# Patient Record
Sex: Male | Born: 1950 | Race: White | Hispanic: No | Smoking: Current every day smoker
Health system: Southern US, Community
[De-identification: ages and names within clinical notes are randomized; demographics above are authoritative.]

## PROBLEM LIST (undated history)

## (undated) DIAGNOSIS — C61 Malignant neoplasm of prostate: Secondary | ICD-10-CM

## (undated) DIAGNOSIS — R351 Nocturia: Secondary | ICD-10-CM

## (undated) DIAGNOSIS — I10 Essential (primary) hypertension: Secondary | ICD-10-CM

## (undated) DIAGNOSIS — M1990 Unspecified osteoarthritis, unspecified site: Secondary | ICD-10-CM

## (undated) HISTORY — PX: PROSTATE BIOPSY: SHX241

## (undated) HISTORY — PX: NO PAST SURGERIES: SHX2092

## (undated) HISTORY — DX: Essential (primary) hypertension: I10

---

## 2008-03-08 DIAGNOSIS — A6920 Lyme disease, unspecified: Secondary | ICD-10-CM

## 2008-03-08 HISTORY — DX: Lyme disease, unspecified: A69.20

## 2019-10-04 NOTE — Progress Notes (Signed)
Subjective: 1. Elevated prostate specific antigen (PSA)   2. Prostate nodule      [name] is a 69 yo male who is sent in consultation by Dr. Monico Blitz for an elevated PSA that was 6.5 in May and 6.6. in June with an 11% f/t ratio.  He has no other GU history.   He has had prior normal PSA's in [place] in the past.  His IPSS is 3.    ROS:  ROS  Not on File  Past Medical History:  Diagnosis Date   Hypertension     History reviewed. No pertinent surgical history.  Social History   Socioeconomic History   Marital status: Widowed    Spouse name: Not on file   Number of children: 1   Years of education: Not on file   Highest education level: Not on file  Occupational History   Occupation: retired  Tobacco Use   Smoking status: Current Every Day Smoker    Packs/day: 0.50    Years: 30.00    Pack years: 15.00   Smokeless tobacco: Never Used  Substance and Sexual Activity   Alcohol use: Not Currently   Drug use: Not on file   Sexual activity: Not on file  Other Topics Concern   Not on file  Social History Narrative   Not on file   Social Determinants of Health   Financial Resource Strain:    Difficulty of Paying Living Expenses:   Food Insecurity:    Worried About Charity fundraiser in the Last Year:    Arboriculturist in the Last Year:   Transportation Needs:    Film/video editor (Medical):    Lack of Transportation (Non-Medical):   Physical Activity:    Days of Exercise per Week:    Minutes of Exercise per Session:   Stress:    Feeling of Stress :   Social Connections:    Frequency of Communication with Friends and Family:    Frequency of Social Gatherings with Friends and Family:    Attends Religious Services:    Active Member of Clubs or Organizations:    Attends Music therapist:    Marital Status:   Intimate Partner Violence:    Fear of Current or Ex-Partner:    Emotionally Abused:    Physically  Abused:    Sexually Abused:     History reviewed. No pertinent family history.  Anti-infectives: Anti-infectives (From admission, onward)   Start     Dose/Rate Route Frequency Ordered Stop   10/05/19 0000  levofloxacin (LEVAQUIN) 750 MG tablet     Discontinue        10/05/19 0916        Current Outpatient Medications  Medication Sig Dispense Refill   amLODipine (NORVASC) 10 MG tablet Take 10 mg by mouth daily.     aspirin 81 MG chewable tablet Chew by mouth daily.     hydrochlorothiazide (HYDRODIURIL) 25 MG tablet Take 25 mg by mouth daily.     levofloxacin (LEVAQUIN) 750 MG tablet Take 1 po one hour prior to the procedure. 1 tablet 0   losartan (COZAAR) 100 MG tablet Take 100 mg by mouth daily.     naproxen sodium (ALEVE) 220 MG tablet Take 220 mg by mouth daily.     No current facility-administered medications for this visit.     Objective: Vital signs in last 24 hours: BP 126/75    Pulse 71    Temp 98.1  F (36.7 C)    Ht 6' 1.5" (1.867 m)    Wt (!) 287 lb (130.2 kg)    BMI 37.35 kg/m   Intake/Output from previous day: No intake/output data recorded. Intake/Output this shift: @IOTHISSHIFT @   Physical Exam Vitals reviewed.  Constitutional:      Appearance: Normal appearance. He is obese.  Cardiovascular:     Rate and Rhythm: Normal rate and regular rhythm.     Heart sounds: Normal heart sounds.  Pulmonary:     Effort: Pulmonary effort is normal.     Breath sounds: Normal breath sounds.  Abdominal:     Palpations: Abdomen is soft. There is no mass.     Tenderness: There is no abdominal tenderness.     Hernia: No hernia is present.  Genitourinary:    Comments: Uncirc phallus with adequate meatus. Scrotum, testes and epididymitis normal. AP without lesions. NST without mass. Prostate 2+ with right lateral base nodule. SV normal.  Musculoskeletal:        General: No swelling. Normal range of motion.     Cervical back: Normal range of motion and neck  supple.  Skin:    General: Skin is warm and dry.  Neurological:     General: No focal deficit present.     Mental Status: He is alert and oriented to person, place, and time.  Psychiatric:        Mood and Affect: Mood normal.        Behavior: Behavior normal.     Lab Results:  Results for orders placed or performed in visit on 10/05/19 (from the past 24 hour(s))  Urinalysis, Routine w reflex microscopic     Status: None   Collection Time: 10/05/19  8:47 AM  Result Value Ref Range   Specific Gravity, UA 1.015 1.005 - 1.030   pH, UA 6.5 5.0 - 7.5   Color, UA Yellow Yellow   Appearance Ur Clear Clear   Leukocytes,UA Negative Negative   Protein,UA Negative Negative/Trace   Glucose, UA Negative Negative   Ketones, UA Negative Negative   RBC, UA Negative Negative   Bilirubin, UA Negative Negative   Urobilinogen, Ur 0.2 0.2 - 1.0 mg/dL   Nitrite, UA Negative Negative   Microscopic Examination Comment    Narrative   Performed at:  [facility] [address] [place], [place]  [id number] Lab Director: Mina Marble MT, Phone:  [phone]    BMET No results for input(s): NA, K, CL, CO2, GLUCOSE, BUN, CREATININE, CALCIUM in the last 72 hours. PT/INR No results for input(s): LABPROT, INR in the last 72 hours. ABG No results for input(s): PHART, HCO3 in the last 72 hours.  Invalid input(s): PCO2, PO2  Studies/Results: No results found.   Assessment/Plan: Elevated prostate specific antigen (PSA) He has an elevated PSA with a right base nodule.  He needs a biopsy.  I have reviewed the risks of bleeding, infection and voiding difficulty.  Levaquin sent.     Meds ordered this encounter  Medications   levofloxacin (LEVAQUIN) 750 MG tablet    Sig: Take 1 po one hour prior to the procedure.    Dispense:  1 tablet    Refill:  0     Orders Placed This Encounter  Procedures   [place] PROSTATE BIOPSY MULTIPLE    Standing Status:   Future    Standing Expiration  Date:   11/05/2019    Order Specific Question:   Reason for Exam (SYMPTOM  OR DIAGNOSIS REQUIRED)    Answer:   elevated PSA    Order Specific Question:   Preferred location?    Answer:   [facility]   Urinalysis, Routine w reflex microscopic     Return for He will need f/u 2-3 weeks after the prostate biopsy.    CC: Dr. Monico Blitz.    Irine Seal 10/05/2019 [phone]

## 2019-10-05 ENCOUNTER — Other Ambulatory Visit: Payer: Self-pay | Admitting: Urology

## 2019-10-05 ENCOUNTER — Other Ambulatory Visit: Payer: Self-pay

## 2019-10-05 ENCOUNTER — Ambulatory Visit: Payer: Self-pay | Admitting: Urology

## 2019-10-05 ENCOUNTER — Encounter: Payer: Self-pay | Admitting: Urology

## 2019-10-05 ENCOUNTER — Ambulatory Visit (INDEPENDENT_AMBULATORY_CARE_PROVIDER_SITE_OTHER): Payer: Medicare HMO | Admitting: Urology

## 2019-10-05 VITALS — BP 126/75 | HR 71 | Temp 98.1°F | Ht 73.5 in | Wt 287.0 lb

## 2019-10-05 DIAGNOSIS — N402 Nodular prostate without lower urinary tract symptoms: Secondary | ICD-10-CM

## 2019-10-05 DIAGNOSIS — R972 Elevated prostate specific antigen [PSA]: Secondary | ICD-10-CM

## 2019-10-05 LAB — URINALYSIS, ROUTINE W REFLEX MICROSCOPIC
Bilirubin, UA: NEGATIVE
Glucose, UA: NEGATIVE
Ketones, UA: NEGATIVE
Leukocytes,UA: NEGATIVE
Nitrite, UA: NEGATIVE
Protein,UA: NEGATIVE
RBC, UA: NEGATIVE
Specific Gravity, UA: 1.015 (ref 1.005–1.030)
Urobilinogen, Ur: 0.2 mg/dL (ref 0.2–1.0)
pH, UA: 6.5 (ref 5.0–7.5)

## 2019-10-05 MED ORDER — LEVOFLOXACIN 750 MG PO TABS: ORAL_TABLET | ORAL | 0 refills | Status: DC

## 2019-10-05 NOTE — Progress Notes (Signed)
° °  Appointment Time: 8:30 arrive 8:15 Appointment Date: 11/09/19  Location: [facility] Radiology Department   Prostate Biopsy Instructions  Stop all aspirin or blood thinners (aspirin, plavix, coumadin, warfarin, motrin, ibuprofen, advil, aleve, naproxen, naprosyn) for 7 days prior to the procedure.  If you have any questions about stopping these medications, please contact your primary care physician or cardiologist.  Having a light meal prior to the procedure is recommended.  If you are diabetic or have low blood sugar please bring a small snack or glucose tablet.  A Fleets enema is needed to be purchased over the counter at a local pharmacy and used 2 hours before you scheduled appointment.  This can be purchased over the counter at any pharmacy.  Antibiotics will be administered in the clinic at the time of the procedure and 1 tablet has been sent to your pharmacy. Please take the antibiotic as prescribed.    Please bring someone with you to the procedure to drive you home.   If you have any questions or concerns, please feel free to call the office at [phone] or send a Mychart message.    Thank you, [facility]

## 2019-10-05 NOTE — Progress Notes (Signed)
Urological Symptom Review  Patient is experiencing the following symptoms: None   Review of Systems  Gastrointestinal (upper)  : Negative for upper GI symptoms  Gastrointestinal (lower) : Negative for lower GI symptoms  Constitutional : Negative for symptoms  Skin: Negative for skin symptoms  Eyes: Negative for eye symptoms  Ear/Nose/Throat : Negative for Ear/Nose/Throat symptoms  Hematologic/Lymphatic: Negative for Hematologic/Lymphatic symptoms  Cardiovascular : Negative for cardiovascular symptoms  Respiratory : Negative for respiratory symptoms  Endocrine: Negative for endocrine symptoms  Musculoskeletal: Back pain  Neurological: Negative for neurological symptoms  Psychologic: Negative for psychiatric symptoms  

## 2019-10-05 NOTE — Assessment & Plan Note (Signed)
He has an elevated PSA with a right base nodule.  He needs a biopsy.  I have reviewed the risks of bleeding, infection and voiding difficulty.  Levaquin sent.

## 2019-10-05 NOTE — Patient Instructions (Signed)
   Appointment Time: Appointment Date:  Location: [facility] Radiology Department   Prostate Biopsy Instructions  Stop all aspirin or blood thinners (aspirin, plavix, coumadin, warfarin, motrin, ibuprofen, advil, aleve, naproxen, naprosyn) for 7 days prior to the procedure.  If you have any questions about stopping these medications, please contact your primary care physician or cardiologist.  Having a light meal prior to the procedure is recommended.  If you are diabetic or have low blood sugar please bring a small snack or glucose tablet.  A Fleets enema is needed to be purchased over the counter at a local pharmacy and used 2 hours before you scheduled appointment.  This can be purchased over the counter at any pharmacy.  Antibiotics will be administered in the clinic at the time of the procedure and 1 tablet has been sent to your pharmacy. Please take the antibiotic as prescribed.    Please bring someone with you to the procedure to drive you home.   If you have any questions or concerns, please feel free to call the office at [phone] or send a Mychart message.    Thank you, [facility] 

## 2019-11-08 NOTE — Progress Notes (Unsigned)
Prostate Biopsy Procedure   Informed consent was obtained after discussing risks/benefits of the procedure.  A time out was performed to ensure correct patient identity.  Pre-Procedure: - Last PSA Level: PSA is 6.6.  - Prostate exam findings: right base nodule  - Rocephin 1 gm IM given prophylactically - Levaquin 500 mg administered PO -Transrectal Ultrasound performed using the 10MHz probe.  - Seminal Vesicles: normal. - Prostate: had a hypoechoic lesion in the right base and mid prostate that was firm on biopsy.  There were a few calculi.  No other lesions or anatomical abnormalities were noted.  There was no middle lobe.  - Prostate volume:  47 gm   Procedure: - Prostate block performed using 10 cc 1% lidocaine and biopsies taken from sextant areas, a total of 12 needle biopsies were obtained under ultrasound guidance.    Post-Procedure: - Patient tolerated the procedure well - He was counseled to seek immediate medical attention if experiences any severe pain, significant bleeding, or fevers - He will be called with the biopsy results when available.

## 2019-11-09 ENCOUNTER — Encounter: Payer: Self-pay | Admitting: Urology

## 2019-11-09 ENCOUNTER — Ambulatory Visit (HOSPITAL_COMMUNITY)
Admission: RE | Admit: 2019-11-09 | Discharge: 2019-11-09 | Disposition: A | Discharge status: Home / Self Care | Payer: Medicare HMO | Source: Ambulatory Visit | Attending: Urology | Admitting: Urology

## 2019-11-09 ENCOUNTER — Other Ambulatory Visit: Payer: Self-pay | Admitting: Urology

## 2019-11-09 ENCOUNTER — Other Ambulatory Visit: Payer: Medicare HMO | Admitting: Urology

## 2019-11-09 ENCOUNTER — Other Ambulatory Visit: Payer: Self-pay

## 2019-11-09 DIAGNOSIS — N402 Nodular prostate without lower urinary tract symptoms: Secondary | ICD-10-CM | POA: Diagnosis not present

## 2019-11-09 DIAGNOSIS — C61 Malignant neoplasm of prostate: Secondary | ICD-10-CM | POA: Diagnosis not present

## 2019-11-09 DIAGNOSIS — R972 Elevated prostate specific antigen [PSA]: Secondary | ICD-10-CM | POA: Diagnosis present

## 2019-11-09 MED ORDER — LIDOCAINE HCL (PF) 2 % IJ SOLN
INTRAMUSCULAR | Status: AC
  Filled 2019-11-09: qty 10

## 2019-11-09 MED ORDER — GENTAMICIN SULFATE 40 MG/ML IJ SOLN: 160.0000 mg | Freq: Once | INTRAMUSCULAR | Status: AC

## 2019-11-09 MED ORDER — GENTAMICIN SULFATE 40 MG/ML IJ SOLN
INTRAMUSCULAR | Status: AC
  Administered 2019-11-09: 160 mg via INTRAMUSCULAR
  Filled 2019-11-09: qty 4

## 2019-11-16 ENCOUNTER — Other Ambulatory Visit: Payer: Self-pay | Admitting: Urology

## 2019-11-16 DIAGNOSIS — C61 Malignant neoplasm of prostate: Secondary | ICD-10-CM

## 2019-11-20 ENCOUNTER — Other Ambulatory Visit: Payer: Self-pay

## 2019-11-20 ENCOUNTER — Encounter (HOSPITAL_COMMUNITY): Payer: Self-pay

## 2019-11-20 ENCOUNTER — Ambulatory Visit (HOSPITAL_COMMUNITY)
Admission: RE | Admit: 2019-11-20 | Discharge: 2019-11-20 | Disposition: A | Discharge status: Home / Self Care | Payer: Medicare HMO | Source: Ambulatory Visit | Attending: Urology | Admitting: Urology

## 2019-11-20 DIAGNOSIS — C61 Malignant neoplasm of prostate: Secondary | ICD-10-CM | POA: Diagnosis present

## 2019-11-20 LAB — POCT I-STAT CREATININE: Creatinine, Ser: 1 mg/dL (ref 0.61–1.24)

## 2019-11-20 MED ORDER — IOHEXOL 300 MG/ML  SOLN
100.0000 mL | Freq: Once | INTRAMUSCULAR | Status: AC | PRN
  Administered 2019-11-20: 100 mL via INTRAVENOUS

## 2019-11-21 ENCOUNTER — Encounter (HOSPITAL_COMMUNITY)
Admission: RE | Admit: 2019-11-21 | Discharge: 2019-11-21 | Disposition: A | Discharge status: Home / Self Care | Payer: Medicare HMO | Source: Ambulatory Visit | Attending: Urology | Admitting: Urology

## 2019-11-21 DIAGNOSIS — C61 Malignant neoplasm of prostate: Secondary | ICD-10-CM | POA: Diagnosis present

## 2019-11-21 MED ORDER — TECHNETIUM TC 99M MEDRONATE IV KIT
20.0000 | PACK | Freq: Once | INTRAVENOUS | Status: AC | PRN
  Administered 2019-11-21: 18 via INTRAVENOUS

## 2019-11-22 NOTE — Progress Notes (Signed)
Subjective: 1. Prostate cancer ([place])   2. Elevated PSA   3. Prostate nodule      Create Bx note  Andrew Gordon returns today in f/u from his prostate biopsy that demonstrated 5/6 Right and 1/6 left cores with cancer.  2 on the left with GG3 and the remainder with GG2.  The prostate volume was 46.89ml.   He has T2a N0 M0 with a right base nodule and a negative CT and [place].   His IPSS was 3 on his initial visit and he doesn't report erectile dysfunction.   Andrew Gordon is a 69 yo male who is sent in consultation by Dr. Monico Blitz for an elevated PSA that was 6.5 in May and 6.6. in June with an 11% f/t ratio.  He has no other GU history.   He has had prior normal PSA's in [place] in the past.  His IPSS is 3.    ROS:  ROS  Not on File  Past Medical History:  Diagnosis Date   Hypertension    Prostate ca dx'd 04/2019    History reviewed. No pertinent surgical history.  Social History   Socioeconomic History   Marital status: Widowed    Spouse name: Not on file   Number of children: 1   Years of education: Not on file   Highest education level: Not on file  Occupational History   Occupation: retired  Tobacco Use   Smoking status: Current Every Day Smoker    Packs/day: 0.50    Years: 30.00    Pack years: 15.00   Smokeless tobacco: Never Used  Substance and Sexual Activity   Alcohol use: Not Currently   Drug use: Not on file   Sexual activity: Not on file  Other Topics Concern   Not on file  Social History Narrative   Not on file   Social Determinants of Health   Financial Resource Strain:    Difficulty of Paying Living Expenses: Not on file  Food Insecurity:    Worried About [place] in the Last Year: Not on file   Ran Out of Food in the Last Year: Not on file  Transportation Needs:    Lack of Transportation (Medical): Not on file   Lack of Transportation (Non-Medical): Not on file  Physical Activity:    Days of Exercise per Week: Not on  file   Minutes of Exercise per Session: Not on file  Stress:    Feeling of Stress : Not on file  Social Connections:    Frequency of Communication with Friends and Family: Not on file   Frequency of Social Gatherings with Friends and Family: Not on file   Attends Religious Services: Not on file   Active Member of Clubs or Organizations: Not on file   Attends Archivist Meetings: Not on file   Marital Status: Not on file  Intimate Partner Violence:    Fear of Current or Ex-Partner: Not on file   Emotionally Abused: Not on file   Physically Abused: Not on file   Sexually Abused: Not on file    History reviewed. No pertinent family history.  Anti-infectives: Anti-infectives (From admission, onward)   None      Current Outpatient Medications  Medication Sig Dispense Refill   amLODipine (NORVASC) 10 MG tablet Take 10 mg by mouth daily.     aspirin 81 MG chewable tablet Chew by mouth daily.     hydrochlorothiazide (HYDRODIURIL) 25 MG tablet Take 25  mg by mouth daily.     levofloxacin (LEVAQUIN) 750 MG tablet Take 1 po one hour prior to the procedure. 1 tablet 0   losartan (COZAAR) 100 MG tablet Take 100 mg by mouth daily.     naproxen sodium (ALEVE) 220 MG tablet Take 220 mg by mouth daily.     No current facility-administered medications for this visit.     Objective: Vital signs in last 24 hours: There were no vitals taken for this visit.  Intake/Output from previous day: No intake/output data recorded. Intake/Output this shift: @IOTHISSHIFT @   Physical Exam  Lab Results:  No results found for this or any previous visit (from the past 24 hour(s)).  BMET No results for input(s): NA, K, CL, CO2, GLUCOSE, BUN, CREATININE, CALCIUM in the last 72 hours. PT/INR No results for input(s): LABPROT, INR in the last 72 hours. ABG No results for input(s): PHART, HCO3 in the last 72 hours.  Invalid input(s): PCO2, PO2  Studies/Results: Prostate  Bx results reviewed.  NM Bone Scan Whole Body  Result Date: 11/22/2019 CLINICAL DATA:  History of prostate cancer. EXAM: NUCLEAR MEDICINE WHOLE BODY BONE SCAN TECHNIQUE: Whole body anterior and posterior images were obtained approximately 3 hours after intravenous injection of radiopharmaceutical. RADIOPHARMACEUTICALS:  18.0 mCi Technetium-71m MDP IV COMPARISON:  November 20, 2019. FINDINGS: Symmetric abnormal uptake is noted in both feet consistent with degenerative change. No other areas of abnormal uptake are noted. IMPRESSION: No definite scintigraphic evidence of osseous metastases. Electronically Signed   By: Marijo Conception M.D.   On: 11/22/2019 12:46   CT ABDOMEN PELVIS W CONTRAST  Result Date: 11/20/2019 CLINICAL DATA:  Prostate cancer, Gleason 7 (4+3), diagnosed last month. No previous relevant surgery. EXAM: CT ABDOMEN AND PELVIS WITH CONTRAST TECHNIQUE: Multidetector CT imaging of the abdomen and pelvis was performed using the standard protocol following bolus administration of intravenous contrast. CONTRAST:  175mL OMNIPAQUE IOHEXOL 300 MG/ML  SOLN COMPARISON:  None. FINDINGS: Lower chest: Evaluation of the lung bases is limited by breathing artifact. There are probable mild emphysematous changes as well as coronary artery calcifications. Hepatobiliary: The liver is normal in density without suspicious focal abnormality. No evidence of gallstones, gallbladder wall thickening or biliary dilatation. Pancreas: Unremarkable. No pancreatic ductal dilatation or surrounding inflammatory changes. Spleen: Normal in size without focal abnormality. Adrenals/Urinary Tract: Both adrenal glands appear normal. Punctate calcification in the upper pole of the right kidney on coronal image 76/15 is probably renovascular. No definite urinary tract calcifications. There are bilateral renal cysts, largest in the interpolar region of the right kidney, measuring 4.4 cm. No evidence of enhancing renal mass or  hydronephrosis. The bladder appears normal. Stomach/Bowel: No evidence of bowel wall thickening, distention or surrounding inflammatory change. Vestigial appearing appendix without acute inflammation. Minimal distal colonic diverticulosis. Small duodenal diverticula. Vascular/Lymphatic: There are no enlarged abdominal or pelvic lymph nodes. There are small lymph nodes within the porta hepatis and retroperitoneum which are not pathologically enlarged. Mild aortic and branch vessel atherosclerosis without acute vascular findings. The portal, superior mesenteric and splenic veins are patent. Reproductive: No focal abnormality of the prostate gland or seminal vesicles by CT. Other: No evidence of abdominal wall mass or hernia. No ascites. Musculoskeletal: No acute or significant osseous findings. No evidence of osseous metastatic disease. Mild degenerative changes in the spine and both hips with small subchondral cysts in the acetabula. IMPRESSION: 1. No evidence of metastatic prostate cancer. 2. Bilateral renal cysts. 3. Aortic Atherosclerosis (ICD10-I70.0). Electronically Signed  By: Richardean Sale M.D.   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     I have reviewed the treatment options.   I don't believe he is a good candidate for active surveillance with his pathology, age and state of health.  I mentioned primary cryo and HIFU but don't feel those would be as appropriate for him as more standard therapies.     He is a candidate for radical prostatectomy and I reviewed the risks and benefits in detail.  He is a candidate for EXRT and I reviewed the risks and benefits in detail including the need for markers and SpaceOAR.   He would also need adjuvant ADT for 6-8 months and I reviewed the side effects and benefits of that treatment as well.  He may be a candidate for brachytherapy but I will defer to Dr.  Tammi Klippel whether he would need  combination therapy based on his stage and grade of tumor.   He is most interested in Brachytherapy if that is an appropriate option.     I have included detailed information on these options in the patient instructions.  Marland Kitchen    He will be scheduled to see Dr. Tammi Klippel to discuss the radiation therapy options.   No orders of the defined types were placed in this encounter.    Orders Placed This Encounter  Procedures   Ambulatory referral to Radiation Oncology    Referral Priority:   Routine    Referral Type:   Consultation    Referral Reason:   Specialty Services Required    Referred to Provider:   Tyler Pita, MD    Requested Specialty:   Radiation Oncology    Number of Visits Requested:   1     Return for F/U will depend on the plan from the radiation oncology consultation. .    CC: Dr. Monico Blitz.    Andrew Gordon 11/23/2019 [phone] ID: Metro Kung, male   DOB: 10-Sep-1950, 68 y.o.   MRN: [id number]

## 2019-11-23 ENCOUNTER — Other Ambulatory Visit: Payer: Self-pay

## 2019-11-23 ENCOUNTER — Ambulatory Visit (INDEPENDENT_AMBULATORY_CARE_PROVIDER_SITE_OTHER): Payer: Medicare HMO | Admitting: Urology

## 2019-11-23 ENCOUNTER — Encounter: Payer: Self-pay | Admitting: Urology

## 2019-11-23 DIAGNOSIS — N402 Nodular prostate without lower urinary tract symptoms: Secondary | ICD-10-CM | POA: Diagnosis not present

## 2019-11-23 DIAGNOSIS — R972 Elevated prostate specific antigen [PSA]: Secondary | ICD-10-CM

## 2019-11-23 DIAGNOSIS — C61 Malignant neoplasm of prostate: Secondary | ICD-10-CM | POA: Diagnosis not present

## 2019-11-23 NOTE — Patient Instructions (Addendum)
Prostate Cancer  The prostate is a walnut-sized gland that is involved in the production of semen. It is located below a man's bladder, in front of the rectum. Prostate cancer is the abnormal growth of cells in the prostate gland. What are the causes? The exact cause of this condition is not known. What increases the risk? This condition is more likely to develop in men who:  Are older than age 67.  Are African-American.  Are obese.  Have a family history of prostate cancer.  Have a family history of breast cancer. What are the signs or symptoms? Symptoms of this condition include:  A need to urinate often.  Weak or interrupted flow of urine.  Trouble starting or stopping urination.  Inability to urinate.  Pain or burning during urination.  Painful ejaculation.  Blood in urine or semen.  Persistent pain or discomfort in the lower back, lower abdomen, hips, or upper thighs.  Trouble getting an erection.  Trouble emptying the bladder all the way. How is this diagnosed? This condition can be diagnosed with:  A digital rectal exam. For this exam, a health care provider inserts a gloved finger into the rectum to feel the prostate gland.  A blood test called a prostate-specific antigen (PSA) test.  An imaging test called transrectal ultrasonography.  A procedure in which a sample of tissue is taken from the prostate and examined under a microscope (prostate biopsy). Once the condition is diagnosed, tests will be done to determine how far the cancer has spread. This is called staging the cancer. Staging may involve imaging tests, such as:  A bone scan.  A CT scan.  A PET scan.  An MRI. The stages of prostate cancer are as follows:  Stage I. At this stage, the cancer is found in the prostate only. The cancer is not visible on imaging tests and it is usually found by accident, such as during a prostate surgery.  Stage II. At this stage, the cancer is more advanced  than it is in stage I, but the cancer has not spread outside the prostate.  Stage III. At this stage, the cancer has spread beyond the outer layer of the prostate to nearby tissues. The cancer may be found in the seminal vesicles, which are near the bladder and the prostate.  Stage IV. At this stage, the cancer has spread other parts of the body, such as the lymph nodes, bones, bladder, rectum, liver, or lungs. How is this treated? Treatment for this condition depends on several factors, including the stage of the cancer, your age, personal preferences, and your overall health. Talk with your health care provider about treatment options that are recommended for you. Common treatments include:  Observation for early stage prostate cancer (active surveillance). This involves having exams, blood tests, and in some cases, more biopsies. For some men, this is the only treatment needed.  Surgery. Types of surgeries include: ? Open surgery. In this surgery, a larger incision is made to remove the prostate. ? A laparoscopic prostatectomy. This is a surgery to remove the prostate and lymph nodes through several, small incisions. It is often referred to as a minimally invasive surgery. ? A robotic prostatectomy. This is a surgery to remove the prostate and lymph nodes with the help of a robotic arm that is controlled by a computer. ? Orchiectomy. This is a surgery to remove the testicles. ? Cryosurgery. This is a surgery to freeze and destroy cancer cells.  Radiation treatment. Types   of radiation treatment include: ? External beam radiation. This type aims beams of radiation from outside the body at the prostate to destroy cancerous cells. ? Brachytherapy. This type uses radioactive needles, seeds, wires, or tubes that are implanted into the prostate gland. Like external beam radiation, brachytherapy destroys cancerous cells. An advantage is that this type of radiation limits the damage to surrounding  tissue and has fewer side effects.  High-intensity, focused ultrasonography. This treatment destroys cancer cells by delivering high-energy ultrasound waves to the cancerous cells.  Chemotherapy medicines. This treatment kills cancer cells or stops them from multiplying.  Hormone treatment. This treatment involves taking medicines that act on one of the male hormones (testosterone): ? By stopping your body from producing testosterone. ? By blocking testosterone from reaching cancer cells. Follow these instructions at home:  Take over-the-counter and prescription medicines only as told by your health care provider.  Maintain a healthy diet.  Get plenty of sleep.  Consider joining a support group for men who have prostate cancer. Meeting with a support group may help you learn to cope with the stress of having cancer.  Keep all follow-up visits as told by your health care provider. This is important.  If you have to go to the hospital, notify your cancer specialist (oncologist).  Treatment for prostate cancer may affect sexual function. Continue to have intimate moments with your partner. This may include touching, holding, hugging, and caressing. Contact a health care provider if:  You have trouble urinating.  You have blood in your urine.  You have pain in your hips, back, or chest. Get help right away if:  You have weakness or numbness in your legs.  You cannot control urination or your bowel movements (incontinence).  You have trouble breathing.  You have sudden chest pain.  You have chills or a fever. Summary  The prostate is a walnut-sized gland that is involved in the production of semen. It is located below a man's bladder, in front of the rectum. Prostate cancer is the abnormal growth of cells in the prostate gland.  Treatment for this condition depends on several factors, including the stage of the cancer, your age, personal preferences, and your overall health.  Talk with your health care provider about treatment options that are recommended for you.  Consider joining a support group for men who have prostate cancer. Meeting with a support group may help you learn to cope with the stress of having cancer. This information is not intended to replace advice given to you by your health care provider. Make sure you discuss any questions you have with your health care provider. Document Revised: 02/04/2017 Document Reviewed: 11/03/2015 Elsevier Patient Education  2020 [name] [facility] Prostate Cancer Brachytherapy for prostate cancer is radiation treatment that is placed inside of the prostate (prostate gland). There are several types of brachytherapy:  Low-dose rate (LDR) therapy. This may involve temporary implants or permanent radioactive seed or pellet implants. The radiation does not travel far from the prostate, which means that healthy, noncancerous tissues around the prostate receive only a small dose of radiation. This helps to protect those tissues from injury. This type of treatment may be followed by a course of external beam radiation. ? Temporary low-dose implants are left in the prostate for 1-7 days. The implants are needles, applicators, or thin, plastic tubes (catheters) that contain radioactive material. You will need to stay in the hospital while the implant is in place. ? Permanent low-dose  implants (seeds or pellets) are injected into the prostate, and they work for up to one year after they are inserted. They are left in place and are not removed.  High-dose rate (HDR) therapy. This is given through needles, applicators, or catheters that contain radioactive material. The tubes are removed after treatment, and no radiation is left in the prostate. This type of treatment may be followed by a course of external beam radiation. Tell a health care provider about:  Any allergies you have.  All medicines you are taking,  including vitamins, herbs, eye drops, creams, and over-the-counter medicines.  Any problems you or family members have had with anesthetic medicines.  Any surgeries you have had.  Any blood disorders you have.  Any medical conditions you have. What are the risks? Generally, this is a safe procedure. However, problems may occur, including:  Inflammation of the rectum.  Problems getting or keeping an erection (erectile dysfunction).  Trouble urinating.  Diarrhea.  Bleeding.  Loss of bowel control. What happens before the procedure? Staying hydrated Follow instructions from your health care provider about hydration, which may include:  Up to 2 hours before the procedure - you may continue to drink clear liquids, such as water, clear fruit juice, black coffee, and plain tea. Eating and drinking Follow instructions from your health care provider about eating and drinking, which may include:  8 hours before the procedure - stop eating heavy meals or foods such as meat, fried foods, or fatty foods.  6 hours before the procedure - stop eating light meals or foods, such as toast or cereal.  6 hours before the procedure - stop drinking milk or drinks that contain milk.  2 hours before the procedure - stop drinking clear liquids. Medicines  Ask your health care provider about: ? Changing or stopping your regular medicines. This is especially important if you are taking diabetes medicines or blood thinners. ? Taking medicines such as aspirin and ibuprofen. These medicines can thin your blood. Do not take these medicines before your procedure if your health care provider instructs you not to.  You may be given antibiotic medicine to help prevent infection. General instructions  Plan to have someone take you home from the hospital or clinic.  If you will be going home right after the procedure, plan to have someone with you for 24 hours.  You may have imaging tests done,  including an ultrasound, CT scan, or MRI.  You may have blood tests done.  You may have a test to check the electrical signals in your heart (electrocardiogram).  You may need to take medicine to clean out your bowel (bowel prep). What happens during the procedure?  To lower your risk of infection: ? Your health care team will wash or sanitize their hands. ? Your skin will be washed with soap. ? Hair may be removed from the surgical area.  An IV will be inserted into one of your veins.  You will be given one or more of the following: ? A medicine to help you relax (sedative). ? A medicine to numb the area (local anesthetic). ? A medicine to make you fall asleep (general anesthetic).  You may have a thin, plastic tube (catheter) inserted to drain your bladder.  If you are receiving brachytherapy with implants: ? A needle, applicator, or catheter will be inserted into the prostate. It will be inserted through a body cavity, such as the rectum, or through the tissue between the testicles and  the anus (perineum). ? An X-ray, ultrasound, MRI, or CT scan will be used to guide the catheter or applicator toward the prostate. ? Radioactive seeds, wires, or ribbons will be fed through the catheter or applicator. ? If the high-dose method is used:  The radioactive wires or ribbons will be left in for a few minutes and then removed.  Once the treatment is finished, the catheter or applicator will be removed. ? If the low-dose method is used, the implant will stay in place for 1-7 days.  You will remain in the hospital while the implant is in place.  Once the treatment is finished, the radioactive material and catheter will be removed.  If you are receiving permanent, low-dose brachytherapy: ? Small, radioactive seeds or pellets will be injected into your prostate. This may be done through a catheter, needle, or applicator. ? The catheter or applicator will be removed, leaving the seeds in  the prostate. The procedure may vary among health care providers and hospitals. What happens after the procedure?  Your blood pressure, heart rate, breathing rate, and blood oxygen level will be monitored until the medicines you were given have worn off.  Do not drive for 24 hours if you were given a sedative. Summary  Brachytherapy for prostate cancer is radiation treatment placed inside of the prostate (prostate gland).  There are several types of brachytherapy for prostate cancer, including low-dose temporary treatment, low-dose permanent treatment, and high-dose temporary treatment.  Temporary low-dose implants are left in the prostate for 1-7 days.  Permanent low-dose implants are injected into the prostate and left in place. They work for up to one year after they are inserted.  Permanent high-dose therapy is given through tubes that contain radioactive material. The tubes are removed after treatment, and no radiation is left in the prostate. This information is not intended to replace advice given to you by your health care provider. Make sure you discuss any questions you have with your health care provider. Document Revised: 02/04/2017 Document Reviewed: 03/03/2016 Elsevier Patient Education  2020 [place] Laparoscopic Prostatectomy  Robot-assisted laparoscopic prostatectomy is a minimally invasive procedure to remove the entire prostate gland and the seminal vesicles, which are near the bladder and the prostate. This procedure is done to treat prostate cancer that has not spread to other parts of the body (has not metastasized). The goal of the procedure is to remove all cancer cells and to prevent prostate cancer from metastasizing. During your robot-assisted laparoscopic prostatectomy, your surgeon will use a thin, lighted tube with a tiny camera on the end (laparoscope). The laparoscope will allow your surgeon to do the surgery with several small incisions  in the abdomen instead of a large incision. Your surgeon will also use a robotic arm during the procedure that will be operated through a computer. During your procedure, the lymph glands in your pelvis may be removed. Lymph glands are part of your body's disease-fighting system (immune system). The lymph glands in the pelvis may be the first place that is affected by the spreading (metastasis) of prostate cancer. If your pelvic lymph glands are removed, tissue from the glands will be tested for cancer cells. Tell a health care provider about:  Any allergies you have.  All medicines you are taking, including vitamins, herbs, eye drops, creams, and over-the-counter medicines.  Any problems you or family members have had with anesthetic medicines.  Any blood disorders you have.  Any surgeries you have had.  Any  medical conditions you have.  Any prostate infections you have had. What are the risks? Generally, this is a safe procedure. However, problems may occur, including:  Infection.  Bleeding.  Allergic reactions to medicines.  Inability to control when you urinate (incontinence). This is usually a temporary effect of this procedure.  Blockage (obstruction) of the large or small intestines.  Narrowing or scarring of the urethra (stricture), which may block the flow of urine.  Inability to get or sustain an erection (erectile dysfunction).  Dry ejaculation. This is when no semen is released during orgasm.  Blood clots in the legs.  The formation of a sac (cyst) in the pelvis that is filled with fluid from the lymph glands (lymphocele). There is also a risk of damage to other structures or organs, such as:  The tubes that drain urine from the kidneys to the bladder (ureters).  The bladder.  The urethra.  The large or small intestines.  The rectum.  Vascular arteries or veins.  Nerve tissue. What happens before the procedure? Staying hydrated Follow instructions  from your health care provider about hydration, which may include:  Up to 2 hours before the procedure - you may continue to drink clear liquids, such as water, clear fruit juice, black coffee, and plain tea. Eating and drinking restrictions Follow instructions from your health care provider about eating and drinking, which may include:  8 hours before the procedure - stop eating heavy meals or foods such as meat, fried foods, or fatty foods.  6 hours before the procedure - stop eating light meals or foods, such as toast or cereal.  6 hours before the procedure - stop drinking milk or drinks that contain milk.  2 hours before the procedure - stop drinking clear liquids. Medicines  Ask your health care provider about: ? Changing or stopping your regular medicines. This is especially important if you are taking diabetes medicines or blood thinners. ? Taking medicines such as aspirin and ibuprofen. These medicines can thin your blood. Do not take these medicines before your procedure if your health care provider instructs you not to.  You may be given antibiotic medicine to help prevent infection. General instructions  Ask your health care provider how your surgical site will be marked or identified.  You may have an exam or testing.  You may have additional tests, such as a CT scan or MRI.  You may have a blood or urine sample taken. What happens during the procedure?  To reduce your risk of infection: ? Your health care team will wash or sanitize their hands. ? Your skin will be washed with soap. ? Hair will be removed from your surgical area.  An IV tube will be inserted into one of your veins.  You will be given one or more of the following: ? A medicine to help you relax (sedative). ? A medicine to make you fall asleep (general anesthetic). ? A medicine that is injected into your spine to numb the area below and slightly above the injection site (spinal anesthetic).  A  thin, flexible tube (catheter) will be inserted through your urethra and into your bladder. The catheter will drain urine from your bladder during the procedure and while you heal.  Four or five small incisions will be made in your abdomen.  The laparoscope and other surgical instruments will be put through the incisions. Your surgeon will use the laparoscope and a robotic arm to help control the surgical instruments.  Your  urethra will be cut and separated from your bladder, and your prostate and seminal vesicles will be removed.  Your pelvic lymph glands may also be removed for testing.  Your urethra will be reconnected to your bladder. This will be done so your catheter is still in place inside of your urethra.  A small tube (drain) may be placed in one or more of your incisions to help drain extra fluid from your surgical site.  Your incisions will be closed with stitches (sutures), skin glue, or adhesive strips.  Medicine may be applied to your incisions.  Bandages (dressings) will be placed over your incisions. What happens after the procedure?  Your blood pressure, heart rate, breathing rate, and blood oxygen level will be monitored until the medicines you were given have worn off.  You may continue to receive fluids and medicines through an IV tube.  You will have some pain. Pain medicines will be available to help you.  You will have a catheter to drain urine from your bladder.  You may have fluid coming from a drain in your incision.  You may have to wear compression stockings. These stockings help to prevent blood clots and reduce swelling in your legs.  You will be encouraged to move around as much as possible. This information is not intended to replace advice given to you by your health care provider. Make sure you discuss any questions you have with your health care provider. Document Revised: 02/04/2017 Document Reviewed: 12/05/2015 Elsevier Patient Education   [place].  External Beam Radiation Therapy External beam radiation therapy is a type of radiation treatment. This type of radiation therapy can deliver radiation to a fairly large area. This is the most common type of radiation therapy for cancer. This therapy may be done to:  Treat cancer by: ? Destroying cancer cells. Radiation delivered during the treatment damages cancer cells. It may also damage normal cells, but normal cells have the DNA to repair themselves while cancer cells do not. ? Helping with symptoms of your cancer. ? Stopping the growth of any remaining cancer cells after surgery. ? Preventing cancer cells from growing in areas that do not have cancer (prophylactic radiation therapy).  Treat or shrink a tumor.  Reduce pain (palliative therapy). The amount of radiation you will receive and the length of therapy depend on your medical condition. You should not feel the radiation being delivered or any pain during your therapy. Let your health care provider know about:  Any allergies you have.  All medicines you are taking, including vitamins, herbs, eye drops, creams, and over-the-counter medicines.  Any problems you or family members have had with anesthetic medicines.  Any blood disorders you have.  Any surgeries you have had.  Any medical conditions you have.  Whether you are pregnant or may be pregnant. What are the risks? Generally, this is a safe procedure. However, radiation therapy can place a person at a higher risk for a second cancer later in life. Most people experience side effects from the therapy. Side effects depend on the amount of radiation and the part of the body that was exposed to radiation. The most common side effects include:  Skin changes.  Hair loss.  Fatigue.  Nausea and vomiting. What happens before the procedure?  There will be a planning session (simulation). During the session: ? Your health care provider will plan  exactly where the radiation will be delivered (treatment field). ? You will be positioned for your  therapy. The goal is to have a position that can be reproduced for each therapy session. ? Temporary marks may be drawn on your body. Permanent marks may also be drawn on your body in order for you to be positioned the same way for each therapy session. ? A tool that holds a body part in place (immobilization device) may be used to keep the area of treatment in the correct position.  Follow instructions from your health care provider about eating or drinking restrictions.  Ask your health care provider about changing or stopping your regular medicines. This is especially important if you are taking diabetes medicines or blood thinners. What happens during the procedure?   You will either lie on a table or sit in a chair in the position determined for your therapy.  You may have a heavy shield placed on you to protect tissues and organs that are not being treated.  The radiation machine (linear accelerator) will move around you to deliver the radiation in exact doses from different angles. The machine will not touch you. The procedure may vary among health care providers and hospitals. What happens after the procedure?  You may return to your normal routine including diet, activities, and medicines as told by your health care provider.  You may want to have someone take you home from the hospital or clinic. Summary  External beam radiation therapy is a type of radiation treatment for cancer.  The amount of radiation you will receive and the length of therapy depend on your medical condition.  Most people experience side effects from the therapy. Side effects depend on the amount of radiation and the part of the body that was exposed to radiation. This information is not intended to replace advice given to you by your health care provider. Make sure you discuss any questions you have with  your health care provider. Document Revised: 02/23/2017 Document Reviewed: 02/23/2016 Elsevier Patient Education  [place].

## 2019-11-23 NOTE — Procedures (Signed)
Prostate Biopsy Procedure   Informed consent was obtained after discussing risks/benefits of the procedure.  A time out was performed to ensure correct patient identity.  Pre-Procedure: - Last PSA Level: 6.6 - Rocephin given prophylactically - Levaquin 750 mg administered PO -Transrectal Ultrasound performed revealing a 46 gm prostate - Prostate zones are well delineated and the SV's are normal. -Hypoechoic lesion of the right base and mid prostate.  Procedure: - Prostate block performed using 10 cc 1% lidocaine and needle biopsies were taken from sextant areas, a total of 12 under ultrasound guidance.  Post-Procedure: - Patient tolerated the procedure well - He was counseled to seek immediate medical attention if experiences any severe pain, significant bleeding, or fevers - Return in one week to discuss biopsy results

## 2019-11-26 ENCOUNTER — Telehealth: Payer: Self-pay | Admitting: *Deleted

## 2019-11-26 NOTE — Telephone Encounter (Signed)
LVM  for callback to scheduled appointment with Dr. Tammi Klippel.

## 2019-11-27 ENCOUNTER — Ambulatory Visit
Admission: RE | Admit: 2019-11-27 | Discharge: 2019-11-27 | Disposition: A | Discharge status: Home / Self Care | Payer: Medicare HMO | Source: Ambulatory Visit | Attending: Radiation Oncology | Admitting: Radiation Oncology

## 2019-11-27 ENCOUNTER — Encounter: Payer: Self-pay | Admitting: Radiation Oncology

## 2019-11-27 ENCOUNTER — Other Ambulatory Visit: Payer: Self-pay

## 2019-11-27 VITALS — Ht 73.0 in | Wt 287.0 lb

## 2019-11-27 DIAGNOSIS — C61 Malignant neoplasm of prostate: Secondary | ICD-10-CM

## 2019-11-27 HISTORY — DX: Malignant neoplasm of prostate: C61

## 2019-11-27 NOTE — Progress Notes (Signed)
Radiation Oncology         [phone] ________________________________  Initial Outpatient Consultation - Conducted via Telephone due to current COVID-19 concerns for limiting patient exposure  Name: Andrew Gordon MRN: [id number]  Date: 11/27/2019  DOB: November 05, 1950  WU:[id number], [name]  Irine Seal, MD   REFERRING PHYSICIAN: Irine Seal, MD  DIAGNOSIS: 69 y.o. gentleman with Stage T2a adenocarcinoma of the prostate with Gleason score of 4+3, and PSA of 6.6.    ICD-10-CM   1. Malignant neoplasm of prostate ([place])  C61     HISTORY OF PRESENT ILLNESS: Andrew Gordon is a 69 y.o. male with a diagnosis of prostate cancer. He was noted to have an elevated PSA of 6.5 on routine labs with his primary care physician, Dr. Manuella Ghazi in May 2021.  A repeat PSA 1 month later for confirmation remained elevated at 6.6.  Accordingly, he was referred for evaluation in urology by Dr. Jeffie Pollock on 10/05/19,  digital rectal examination was performed at that time revealing a right lateral base nodule.  The patient proceeded to transrectal ultrasound with 12 biopsies of the prostate on 11/09/2019.  The prostate volume measured 46 cc.  Out of 12 core biopsies, 6 were positive.  The maximum Gleason score was 4+3, and this was seen in the right base and right mid lateral, both with PNI.  Additionally, Gleason 3+4 was seen in the left apex, right mid, right base lateral, and right apex lateral.  To complete his disease staging, he underwent CT A/P on 11/20/19 and bone scan on 11/21/19, both showing no evidence of visceral or osseous metastatic disease.  The patient reviewed the biopsy results with his urologist and he has kindly been referred today for discussion of potential radiation treatment options.  His stepdaughter, Hinton Dyer, accompanies him via conference call for today's visit.   PREVIOUS RADIATION THERAPY: No  PAST MEDICAL HISTORY:  Past Medical History:  Diagnosis Date  . Hypertension   . Prostate  cancer ([place])       PAST SURGICAL HISTORY: Past Surgical History:  Procedure Laterality Date  . PROSTATE BIOPSY      FAMILY HISTORY:  Family History  Problem Relation Age of Onset  . Breast cancer Mother   . Breast cancer Paternal Aunt   . Prostate cancer Neg Hx   . Pancreatic cancer Neg Hx   . Colon cancer Neg Hx     SOCIAL HISTORY:  Social History   Socioeconomic History  . Marital status: Widowed    Spouse name: Not on file  . Number of children: 1  . Years of education: Not on file  . Highest education level: Not on file  Occupational History  . Occupation: retired  Tobacco Use  . Smoking status: Current Every Day Smoker    Packs/day: 0.50    Years: 30.00    Pack years: 15.00    Types: Cigarettes  . Smokeless tobacco: Never Used  Vaping Use  . Vaping Use: Never used  Substance and Sexual Activity  . Alcohol use: Not Currently  . Drug use: Never  . Sexual activity: Not Currently    Comment: widowed. no interested.   Other Topics Concern  . Not on file  Social History Narrative  . Not on file   Social Determinants of Health   Financial Resource Strain:   . Difficulty of Paying Living Expenses: Not on file  Food Insecurity:   . Worried About Charity fundraiser in the Last Year: Not  on file  . Ran Out of Food in the Last Year: Not on file  Transportation Needs:   . Lack of Transportation (Medical): Not on file  . Lack of Transportation (Non-Medical): Not on file  Physical Activity:   . Days of Exercise per Week: Not on file  . Minutes of Exercise per Session: Not on file  Stress:   . Feeling of Stress : Not on file  Social Connections:   . Frequency of Communication with Friends and Family: Not on file  . Frequency of Social Gatherings with Friends and Family: Not on file  . Attends Religious Services: Not on file  . Active Member of Clubs or Organizations: Not on file  . Attends Archivist Meetings: Not on file  . Marital Status: Not on  file  Intimate Partner Violence:   . Fear of Current or Ex-Partner: Not on file  . Emotionally Abused: Not on file  . Physically Abused: Not on file  . Sexually Abused: Not on file    ALLERGIES: Patient has no allergy information on record.  MEDICATIONS:  Current Outpatient Medications  Medication Sig Dispense Refill  . amLODipine (NORVASC) 10 MG tablet Take 10 mg by mouth daily.    Marland Kitchen aspirin 81 MG chewable tablet Chew by mouth daily.    . hydrochlorothiazide (HYDRODIURIL) 25 MG tablet Take 25 mg by mouth daily.    Marland Kitchen losartan (COZAAR) 100 MG tablet Take 100 mg by mouth daily.    . naproxen sodium (ALEVE) 220 MG tablet Take 220 mg by mouth daily.     No current facility-administered medications for this encounter.    REVIEW OF SYSTEMS:  On review of systems, the patient reports that he is doing well overall. He denies any chest pain, shortness of breath, cough, fevers, chills, night sweats, unintended weight changes. He denies any bowel disturbances, and denies abdominal pain, nausea or vomiting. He denies any new musculoskeletal or joint aches or pains. His IPSS was 1, indicating minimal urinary symptoms. His SHIM was 5, indicating he has severe erectile dysfunction. A complete review of systems is obtained and is otherwise negative.    PHYSICAL EXAM:  Wt Readings from Last 3 Encounters:  11/27/19 287 lb (130.2 kg)  10/05/19 (!) 287 lb (130.2 kg)   Temp Readings from Last 3 Encounters:  11/09/19 97.9 F (36.6 C) (Oral)  10/05/19 98.1 F (36.7 C)   BP Readings from Last 3 Encounters:  11/09/19 138/79  10/05/19 126/75   Pulse Readings from Last 3 Encounters:  11/09/19 74  10/05/19 71   Pain Assessment Pain Score: 0-No pain/10  Physical exam not performed in light of telephone consult visit format.   KPS = 90  100 - Normal; no complaints; no evidence of disease. 90   - Able to carry on normal activity; minor signs or symptoms of disease. 80   - Normal activity with  effort; some signs or symptoms of disease. 31   - Cares for self; unable to carry on normal activity or to do active work. 60   - Requires occasional assistance, but is able to care for most of his personal needs. 50   - Requires considerable assistance and frequent medical care. 3   - Disabled; requires special care and assistance. 9   - Severely disabled; hospital admission is indicated although death not imminent. 88   - Very sick; hospital admission necessary; active supportive treatment necessary. 10   - Moribund; fatal processes progressing rapidly.  0     - Dead  Karnofsky DA, Abelmann WH, Craver LS and Burchenal JH [phone]) The use of the nitrogen mustards in the palliative treatment of carcinoma: with particular reference to bronchogenic carcinoma Cancer 1 634-56  LABORATORY DATA:  No results found for: WBC, HGB, HCT, MCV, PLT No results found for: NA, K, CL, CO2 No results found for: ALT, AST, GGT, ALKPHOS, BILITOT   RADIOGRAPHY: NM Bone Scan Whole Body  Result Date: 11/22/2019 CLINICAL DATA:  History of prostate cancer. EXAM: NUCLEAR MEDICINE WHOLE BODY BONE SCAN TECHNIQUE: Whole body anterior and posterior images were obtained approximately 3 hours after intravenous injection of radiopharmaceutical. RADIOPHARMACEUTICALS:  18.0 mCi Technetium-12m MDP IV COMPARISON:  November 20, 2019. FINDINGS: Symmetric abnormal uptake is noted in both feet consistent with degenerative change. No other areas of abnormal uptake are noted. IMPRESSION: No definite scintigraphic evidence of osseous metastases. Electronically Signed   By: Marijo Conception M.D.   On: 11/22/2019 12:46   CT ABDOMEN PELVIS W CONTRAST  Result Date: 11/20/2019 CLINICAL DATA:  Prostate cancer, Gleason 7 (4+3), diagnosed last month. No previous relevant surgery. EXAM: CT ABDOMEN AND PELVIS WITH CONTRAST TECHNIQUE: Multidetector CT imaging of the abdomen and pelvis was performed using the standard protocol following bolus  administration of intravenous contrast. CONTRAST:  158mL OMNIPAQUE IOHEXOL 300 MG/ML  SOLN COMPARISON:  None. FINDINGS: Lower chest: Evaluation of the lung bases is limited by breathing artifact. There are probable mild emphysematous changes as well as coronary artery calcifications. Hepatobiliary: The liver is normal in density without suspicious focal abnormality. No evidence of gallstones, gallbladder wall thickening or biliary dilatation. Pancreas: Unremarkable. No pancreatic ductal dilatation or surrounding inflammatory changes. Spleen: Normal in size without focal abnormality. Adrenals/Urinary Tract: Both adrenal glands appear normal. Punctate calcification in the upper pole of the right kidney on coronal image 76/15 is probably renovascular. No definite urinary tract calcifications. There are bilateral renal cysts, largest in the interpolar region of the right kidney, measuring 4.4 cm. No evidence of enhancing renal mass or hydronephrosis. The bladder appears normal. Stomach/Bowel: No evidence of bowel wall thickening, distention or surrounding inflammatory change. Vestigial appearing appendix without acute inflammation. Minimal distal colonic diverticulosis. Small duodenal diverticula. Vascular/Lymphatic: There are no enlarged abdominal or pelvic lymph nodes. There are small lymph nodes within the porta hepatis and retroperitoneum which are not pathologically enlarged. Mild aortic and branch vessel atherosclerosis without acute vascular findings. The portal, superior mesenteric and splenic veins are patent. Reproductive: No focal abnormality of the prostate gland or seminal vesicles by CT. Other: No evidence of abdominal wall mass or hernia. No ascites. Musculoskeletal: No acute or significant osseous findings. No evidence of osseous metastatic disease. Mild degenerative changes in the spine and both hips with small subchondral cysts in the acetabula. IMPRESSION: 1. No evidence of metastatic prostate cancer.  2. Bilateral renal cysts. 3. Aortic Atherosclerosis (ICD10-I70.0). Electronically Signed   By: Richardean Sale M.D.   On: 11/20/2019 13:50      IMPRESSION/PLAN: This visit was conducted via Telephone to spare the patient unnecessary potential exposure in the healthcare setting during the current COVID-19 pandemic. 1. 69 y.o. gentleman with Stage T2a adenocarcinoma of the prostate with Gleason Score of 4+3, and PSA of 6.6. We discussed the patient's workup and outlined the nature of prostate cancer in this setting. The patient's T stage, Gleason's score, and PSA put him into the unfavorable intermediate risk group. Accordingly, he is eligible for a variety of potential treatment options  including prostatectomy or ST-ADT in combination with either brachytherapy or 5.5 weeks of external radiation. We discussed the available radiation techniques, and focused on the details and logistics of delivery. We discussed and outlined the risks, benefits, short and long-term effects associated with radiotherapy and compared and contrasted these with prostatectomy. We discussed the role of SpaceOAR in reducing the rectal toxicity associated with radiotherapy. We also detailed the role of ADT in the treatment of unfavorable intermediate risk prostate cancer and outlined the associated side effects that could be expected with this therapy. He appears to have a good understanding of his disease and our treatment recommendations which are of curative intent.  He and his stepdaughter, Hinton Dyer, were encouraged to ask questions that were answered to their stated satisfaction.  At the end of the conversation, the patient is interested in moving forward with brachytherapy with SpaceOAR gel to reduce rectal toxicity from radiotherapy, in combination with ST-ADT.  We will share our discussion with Dr. Jeffie Pollock, and make arrangements for a follow-up visit at [facility] urology, first available, to start ADT now and will move forward with  scheduling his CT [facility] planning appointment in the near future in anticipation of proceeding with the brachytherapy procedure in late November 2021, approximately 2 months from the start of ADT.  The patient will be contacted by Romie Jumper in our office who will be working closely with him to coordinate OR scheduling and pre and post procedure appointments.  We will contact the pharmaceutical rep to ensure that [facility] is available at the time of procedure. The patient is comfortable with and in agreement with the stated plan.  Therefore, we will move forward with treatment planning accordingly, in anticipation of scheduling his seed implant approximately 2 months after starting ADT.  We enjoyed meeting him today and look forward to continuing to participate in his care.   Given current concerns for patient exposure during the COVID-19 pandemic, this encounter was conducted via telephone. The patient was notified in advance and was offered a MyChart meeting to allow for face to face communication but unfortunately reported that he did not have the appropriate resources/technology to support such a visit and instead preferred to proceed with telephone consult. The patient has given verbal consent for this type of encounter. The time spent during this encounter was 60 minutes. The attendants for this meeting include Tyler Pita MD, Ashlyn Bruning PA-C, [place], and patient, Kooper Godshall and his step-daughter Hinton Dyer. During the encounter, Tyler Pita MD, Ashlyn Bruning PA-C, and scribe, Wilburn Mylar were located at [place].  Patient, Andrew Gordon was located at home. Step-daughter Hinton Dyer was located at work.    Nicholos Johns, PA-C    Tyler Pita, MD  [facility] Direct Dial: [phone]  Fax: [phone] [email or web address]  Skype  [email or web address]  This document serves as a record of  services personally performed by Tyler Pita, MD and Freeman Caldron, PA-C. It was created on their behalf by Wilburn Mylar, a trained medical scribe. The creation of this record is based on the scribe's personal observations and the provider's statements to them. This document has been checked and approved by the attending provider.

## 2019-11-27 NOTE — Progress Notes (Signed)
GU Location of Tumor / Histology: T2a N0 M0 GG3 unfavorable intermediate risk prostate cancer  If Prostate Cancer, Gleason Score is (4 + 3) and PSA is (6.6). Prostate volume: 46.6 mL.  Myrtie Neither King Mr. Bouvier was sent in consultation by Dr. Monico Blitz for an elevated PSA that was 6.5 in May and 6.6. in June with an 11% f/t ratio.  He has no other GU history.   He has had prior normal PSA's in [place] in the past.  Biopsies of prostate (if applicable) revealed:   Past/Anticipated interventions by urology, if any: prostate biopsy, CT abd/pelvis (negative), bone scan (negative), referral to Dr. Tammi Klippel to discuss brachytherapy  Past/Anticipated interventions by medical oncology, if any: no  Weight changes, if any: no  Bowel/Bladder complaints, if any: IPSS 1. SHIM 5. Denies dysuria or hematuria. Denies urinary leakage or incontinence. Denies any bowel complaints.    Nausea/Vomiting, if any: no  Pain issues, if any:  no  SAFETY ISSUES:  Prior radiation? no  Pacemaker/ICD? no  Possible current pregnancy? no  Is the patient on methotrexate? no  Current Complaints / other details:  69 year old male. Widowed. Current everyday smoker.

## 2019-11-28 NOTE — Progress Notes (Signed)
PA obtained for Lupron 45mg . Good for 1 dose.

## 2019-11-28 NOTE — Progress Notes (Signed)
He is going to pursue ST-ADT and brachy and will get set up for [place].

## 2019-12-13 ENCOUNTER — Other Ambulatory Visit: Payer: Self-pay

## 2019-12-13 ENCOUNTER — Ambulatory Visit (INDEPENDENT_AMBULATORY_CARE_PROVIDER_SITE_OTHER): Payer: Medicare HMO

## 2019-12-13 DIAGNOSIS — C61 Malignant neoplasm of prostate: Secondary | ICD-10-CM

## 2019-12-13 MED ORDER — DEGARELIX ACETATE(240 MG DOSE) 120 MG/VIAL ~~LOC~~ SOLR: 240.0000 mg | Freq: Once | SUBCUTANEOUS | Status: AC

## 2019-12-13 NOTE — Progress Notes (Signed)
Firmagon Sub Q Injection  Due to Prostate Cancer patient is present today for a Firmagon Injection.   Medication: Mills Koller (Degarelix)  Dose: 240mg  Location: right & left upper abdomen Lot: [id number] Exp: 07/2021  Patient tolerated well, no complications were noted  Performed by: Delainy Mcelhiney,LPN   Follow up: Keep scheduled 1 month OV

## 2019-12-17 ENCOUNTER — Encounter: Payer: Self-pay | Admitting: Medical Oncology

## 2019-12-17 NOTE — Progress Notes (Signed)
Spoke with patient to follow up post consult with Dr.Manning/Ashlyn, PA,9/21. I introduced myself as the prostate nurse navigator and discussed my role. He confirmed Firmagon injection with Dr. Jeffie Pollock, 10/07. He is scheduled for CT simulation 11/11. He had questions about seed implant and all were answered. I gave him my contact information and asked him to call me with questions or concerns. He voiced understanding.

## 2019-12-25 ENCOUNTER — Other Ambulatory Visit: Payer: Self-pay | Admitting: Urology

## 2020-01-15 ENCOUNTER — Telehealth: Payer: Self-pay | Admitting: *Deleted

## 2020-01-15 NOTE — Telephone Encounter (Signed)
CALLED PATIENT TO INFORM OF PRE-SEED APPTS. BEING MOVED TO 01-23-20, LVM FOR A RETURN CALL

## 2020-01-17 ENCOUNTER — Ambulatory Visit: Payer: Medicare HMO | Admitting: Radiation Oncology

## 2020-01-17 ENCOUNTER — Ambulatory Visit: Payer: Self-pay | Admitting: Urology

## 2020-01-17 NOTE — Progress Notes (Signed)
Subjective: 1. Prostate cancer [facility])      Mr. Hollibaugh returns today in f/u for continuation of ADT with Lupron after his initial [place] a month ago.  He had no hot flashes.  His IPSS is 2.   He is to have a Seed implant on 02/21/20.  His prostate biopsy on 11/09/19 that demonstrated 5/6 Right and 1/6 left cores with cancer.  2 on the left with GG3 and the remainder with GG2.  The prostate volume was 46.50ml.   He has T2a N0 M0 with a right base nodule and a negative CT and [place].   Mr. Fullam is a 69 yo male who is sent in consultation by Dr. Monico Blitz for an elevated PSA that was 6.5 in May and 6.6. in June with an 11% f/t ratio.  He has no other GU history.   He has had prior normal PSA's in [place] in the past.  His IPSS is 3.    ROS:  ROS  Not on File  Past Medical History:  Diagnosis Date  . Hypertension   . Prostate cancer [facility])     Past Surgical History:  Procedure Laterality Date  . PROSTATE BIOPSY      Social History   Socioeconomic History  . Marital status: Widowed    Spouse name: Not on file  . Number of children: 1  . Years of education: Not on file  . Highest education level: Not on file  Occupational History  . Occupation: retired  Tobacco Use  . Smoking status: Current Every Day Smoker    Packs/day: 0.50    Years: 30.00    Pack years: 15.00    Types: Cigarettes  . Smokeless tobacco: Never Used  Vaping Use  . Vaping Use: Never used  Substance and Sexual Activity  . Alcohol use: Not Currently  . Drug use: Never  . Sexual activity: Not Currently    Comment: widowed. no interested.   Other Topics Concern  . Not on file  Social History Narrative  . Not on file   Social Determinants of Health   Financial Resource Strain:   . Difficulty of Paying Living Expenses: Not on file  Food Insecurity:   . Worried About Charity fundraiser in the Last Year: Not on file  . Ran Out of Food in the Last Year: Not on file  Transportation Needs:   . Lack of  Transportation (Medical): Not on file  . Lack of Transportation (Non-Medical): Not on file  Physical Activity:   . Days of Exercise per Week: Not on file  . Minutes of Exercise per Session: Not on file  Stress:   . Feeling of Stress : Not on file  Social Connections:   . Frequency of Communication with Friends and Family: Not on file  . Frequency of Social Gatherings with Friends and Family: Not on file  . Attends Religious Services: Not on file  . Active Member of Clubs or Organizations: Not on file  . Attends Archivist Meetings: Not on file  . Marital Status: Not on file  Intimate Partner Violence:   . Fear of Current or Ex-Partner: Not on file  . Emotionally Abused: Not on file  . Physically Abused: Not on file  . Sexually Abused: Not on file    Family History  Problem Relation Age of Onset  . Breast cancer Mother   . Breast cancer Paternal Aunt   . Prostate cancer Neg Hx   .  Pancreatic cancer Neg Hx   . Colon cancer Neg Hx     Anti-infectives: Anti-infectives (From admission, onward)   None      Current Outpatient Medications  Medication Sig Dispense Refill  . amLODipine (NORVASC) 10 MG tablet Take 10 mg by mouth daily.    Marland Kitchen aspirin 81 MG chewable tablet Chew by mouth daily.    . hydrochlorothiazide (HYDRODIURIL) 25 MG tablet Take 25 mg by mouth daily.    Marland Kitchen losartan (COZAAR) 100 MG tablet Take 100 mg by mouth daily.    Marland Kitchen losartan-hydrochlorothiazide (HYZAAR) 100-25 MG tablet Take 1 tablet by mouth daily.    . naproxen sodium (ALEVE) 220 MG tablet Take 220 mg by mouth daily.     Current Facility-Administered Medications  Medication Dose Route Frequency Provider Last Rate Last Admin  . degarelix (FIRMAGON) injection 240 mg  240 mg Subcutaneous Once Irine Seal, MD      . Leuprolide Acetate (6 Month) (LUPRON) injection 45 mg  45 mg Intramuscular Once Irine Seal, MD         Objective: Vital signs in last 24 hours: BP 130/71   Pulse 89   Temp 98.1 F  (36.7 C)   Ht 6\' 1"  (1.854 m)   Wt 287 lb (130.2 kg)   BMI 37.87 kg/m   Intake/Output from previous day: No intake/output data recorded. Intake/Output this shift: @IOTHISSHIFT @   Physical Exam Constitutional:      Appearance: Normal appearance. He is obese.  Cardiovascular:     Rate and Rhythm: Normal rate and regular rhythm.     Pulses: Normal pulses.  Pulmonary:     Effort: Pulmonary effort is normal. No respiratory distress.     Breath sounds: Normal breath sounds.  Neurological:     Mental Status: He is alert.     Lab Results:  No results found for this or any previous visit (from the past 24 hour(s)).  BMET No results for input(s): NA, K, CL, CO2, GLUCOSE, BUN, CREATININE, CALCIUM in the last 72 hours. PT/INR No results for input(s): LABPROT, INR in the last 72 hours. ABG No results for input(s): PHART, HCO3 in the last 72 hours.  Invalid input(s): PCO2, PO2  Studies/Results: Prostate Bx results reviewed.  NM Bone Scan Whole Body  Result Date: 11/22/2019 CLINICAL DATA:  History of prostate cancer. EXAM: NUCLEAR MEDICINE WHOLE BODY BONE SCAN TECHNIQUE: Whole body anterior and posterior images were obtained approximately 3 hours after intravenous injection of radiopharmaceutical. RADIOPHARMACEUTICALS:  18.0 mCi Technetium-77m MDP IV COMPARISON:  November 20, 2019. FINDINGS: Symmetric abnormal uptake is noted in both feet consistent with degenerative change. No other areas of abnormal uptake are noted. IMPRESSION: No definite scintigraphic evidence of osseous metastases. Electronically Signed   By: Marijo Conception M.D.   On: 11/22/2019 12:46   [place] Transrectal Complete  Result Date: 12/21/2019 CLINICAL DATA:  Ultrasound was provided for use by the ordering physician.  No provider Interpretation or professional fees incurred.    US Guided Needle Placement  Result Date: 12/21/2019 CLINICAL DATA:  Ultrasound was provided for use by the ordering physician.  No  provider Interpretation or professional fees incurred.    CT ABDOMEN PELVIS W CONTRAST  Result Date: 11/20/2019 CLINICAL DATA:  Prostate cancer, Gleason 7 (4+3), diagnosed last month. No previous relevant surgery. EXAM: CT ABDOMEN AND PELVIS WITH CONTRAST TECHNIQUE: Multidetector CT imaging of the abdomen and pelvis was performed using the standard protocol following bolus administration of intravenous contrast. CONTRAST:  137mL OMNIPAQUE IOHEXOL 300 MG/ML  SOLN COMPARISON:  None. FINDINGS: Lower chest: Evaluation of the lung bases is limited by breathing artifact. There are probable mild emphysematous changes as well as coronary artery calcifications. Hepatobiliary: The liver is normal in density without suspicious focal abnormality. No evidence of gallstones, gallbladder wall thickening or biliary dilatation. Pancreas: Unremarkable. No pancreatic ductal dilatation or surrounding inflammatory changes. Spleen: Normal in size without focal abnormality. Adrenals/Urinary Tract: Both adrenal glands appear normal. Punctate calcification in the upper pole of the right kidney on coronal image 76/15 is probably renovascular. No definite urinary tract calcifications. There are bilateral renal cysts, largest in the interpolar region of the right kidney, measuring 4.4 cm. No evidence of enhancing renal mass or hydronephrosis. The bladder appears normal. Stomach/Bowel: No evidence of bowel wall thickening, distention or surrounding inflammatory change. Vestigial appearing appendix without acute inflammation. Minimal distal colonic diverticulosis. Small duodenal diverticula. Vascular/Lymphatic: There are no enlarged abdominal or pelvic lymph nodes. There are small lymph nodes within the porta hepatis and retroperitoneum which are not pathologically enlarged. Mild aortic and branch vessel atherosclerosis without acute vascular findings. The portal, superior mesenteric and splenic veins are patent. Reproductive: No focal  abnormality of the prostate gland or seminal vesicles by CT. Other: No evidence of abdominal wall mass or hernia. No ascites. Musculoskeletal: No acute or significant osseous findings. No evidence of osseous metastatic disease. Mild degenerative changes in the spine and both hips with small subchondral cysts in the acetabula. IMPRESSION: 1. No evidence of metastatic prostate cancer. 2. Bilateral renal cysts. 3. Aortic Atherosclerosis (ICD10-I70.0). Electronically Signed   By: Richardean Sale M.D.   On: 11/20/2019 13:50   [place] PROSTATE BIOPSY MULTIPLE  Result Date: 12/21/2019 CLINICAL DATA:  Ultrasound was provided for use by the ordering physician.  No provider Interpretation or professional fees incurred.      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   He has elected ADT and a seed implant.  He was given Lupron 45mg  IM today and will f/u in 4 weeks for a post op visit following the seed implant.    Meds ordered this encounter  Medications  . Leuprolide Acetate (6 Month) (LUPRON) injection 45 mg     Orders Placed This Encounter  Procedures  . Urinalysis, Routine w reflex microscopic     Return in about 4 weeks (around 02/15/2020).    CC: Dr. Monico Blitz.    Irine Seal 01/18/2020 [id number] ID: Metro Kung, male   DOB: 09-Oct-1950, 69 y.o.   MRN: [id number]

## 2020-01-18 ENCOUNTER — Encounter (HOSPITAL_COMMUNITY): Admission: RE | Admit: 2020-01-18 | Payer: Medicare HMO | Source: Ambulatory Visit

## 2020-01-18 ENCOUNTER — Ambulatory Visit (INDEPENDENT_AMBULATORY_CARE_PROVIDER_SITE_OTHER): Payer: Medicare HMO | Admitting: Urology

## 2020-01-18 ENCOUNTER — Other Ambulatory Visit: Payer: Self-pay

## 2020-01-18 ENCOUNTER — Ambulatory Visit: Payer: Self-pay | Admitting: Urology

## 2020-01-18 ENCOUNTER — Ambulatory Visit: Payer: Medicare HMO | Admitting: Radiation Oncology

## 2020-01-18 ENCOUNTER — Encounter: Payer: Self-pay | Admitting: Urology

## 2020-01-18 VITALS — BP 130/71 | HR 89 | Temp 98.1°F | Ht 73.0 in | Wt 287.0 lb

## 2020-01-18 DIAGNOSIS — C61 Malignant neoplasm of prostate: Secondary | ICD-10-CM

## 2020-01-18 MED ORDER — LEUPROLIDE ACETATE (6 MONTH) 45 MG IM KIT
45.0000 mg | PACK | Freq: Once | INTRAMUSCULAR | Status: AC
  Administered 2020-01-18: 45 mg via INTRAMUSCULAR

## 2020-01-18 NOTE — Progress Notes (Signed)
    Urological Symptom Review  Patient is experiencing the following symptoms: Get up at night   Review of Systems  Gastrointestinal (upper)  : Negative for upper GI symptoms  Gastrointestinal (lower) : Negative for lower GI symptoms  Constitutional : Negative for symptoms  Skin: Negative for skin symptoms  Eyes: Negative for eye symptoms  Ear/Nose/Throat : Negative for Ear/Nose/Throat symptoms  Hematologic/Lymphatic: Negative for Hematologic/Lymphatic symptoms  Cardiovascular : Negative for cardiovascular symptoms  Respiratory : Negative for respiratory symptoms  Endocrine: Negative for endocrine symptoms  Musculoskeletal: Negative for musculoskeletal symptoms  Neurological: Negative for neurological symptoms  Psychologic: Negative for psychiatric symptoms

## 2020-01-22 ENCOUNTER — Telehealth: Payer: Self-pay | Admitting: *Deleted

## 2020-01-22 NOTE — Telephone Encounter (Signed)
Called patient to remind of his pre-seed appts. for 01-23-20, spoke with patient and he is aware of these appts.

## 2020-01-23 ENCOUNTER — Other Ambulatory Visit: Payer: Self-pay

## 2020-01-23 ENCOUNTER — Ambulatory Visit (HOSPITAL_COMMUNITY)
Admission: RE | Admit: 2020-01-23 | Discharge: 2020-01-23 | Disposition: A | Discharge status: Home / Self Care | Payer: Medicare HMO | Source: Ambulatory Visit | Attending: Urology | Admitting: Urology

## 2020-01-23 ENCOUNTER — Encounter (HOSPITAL_COMMUNITY)
Admission: RE | Admit: 2020-01-23 | Discharge: 2020-01-23 | Disposition: A | Discharge status: Home / Self Care | Payer: Medicare HMO | Source: Ambulatory Visit | Attending: Urology | Admitting: Urology

## 2020-01-23 ENCOUNTER — Ambulatory Visit
Admission: RE | Admit: 2020-01-23 | Discharge: 2020-01-23 | Disposition: A | Discharge status: Home / Self Care | Payer: Medicare HMO | Source: Ambulatory Visit | Attending: Radiation Oncology | Admitting: Radiation Oncology

## 2020-01-23 ENCOUNTER — Ambulatory Visit
Admission: RE | Admit: 2020-01-23 | Discharge: 2020-01-23 | Disposition: A | Discharge status: Home / Self Care | Payer: Medicare HMO | Source: Ambulatory Visit | Attending: Urology | Admitting: Urology

## 2020-01-23 DIAGNOSIS — C61 Malignant neoplasm of prostate: Secondary | ICD-10-CM | POA: Diagnosis present

## 2020-01-23 NOTE — Progress Notes (Addendum)
Andrew Farber pa aware of 02-21-2020 ekg results, pt states has never had ekg done in his life

## 2020-01-23 NOTE — Progress Notes (Signed)
  Radiation Oncology         [phone] ________________________________  Name: Andrew Gordon MRN: [id number]  Date: 01/23/2020  DOB: January 27, 1951  SIMULATION AND TREATMENT PLANNING NOTE PUBIC ARCH STUDY  [id number], [name]  Irine Seal, MD  DIAGNOSIS: 69 y.o. gentleman with Stage T2a adenocarcinoma of the prostate with Gleason score of 4+3, and PSA of 6.6.  Oncology History  Malignant neoplasm of prostate ([place])  11/09/2019 Cancer Staging   Staging form: Prostate, AJCC 8th Edition - Clinical stage from 11/09/2019: Stage IIC (cT2a, cN0, cM0, PSA: 6.6, Grade Group: 3) - Signed by Freeman Caldron, PA-C on 01/23/2020   11/27/2019 Initial Diagnosis   Malignant neoplasm of prostate ([place])       ICD-10-CM   1. Malignant neoplasm of prostate ([place])  C61     COMPLEX SIMULATION:  The patient presented today for evaluation for possible prostate seed implant. He was brought to the radiation planning suite and placed supine on the CT couch. A 3-dimensional image study set was obtained in upload to the planning computer. There, on each axial slice, I contoured the prostate gland. Then, using three-dimensional radiation planning tools I reconstructed the prostate in view of the structures from the transperineal needle pathway to assess for possible pubic arch interference. In doing so, I did not appreciate any pubic arch interference. Also, the patient's prostate volume was estimated based on the drawn structure. The volume was 46.19 cc.  Given the pubic arch appearance and prostate volume, patient remains a good candidate to proceed with prostate seed implant. Today, he freely provided informed written consent to proceed.    PLAN: The patient will undergo prostate seed implant.   ________________________________  Sheral Apley. Tammi Klippel, M.D.

## 2020-01-28 NOTE — Progress Notes (Signed)
Final ekg result sent  to jessica zanetto pa.

## 2020-01-30 NOTE — Progress Notes (Signed)
ekg to be sent to pcp for evaluation per Janett Billow zanetto pa

## 2020-02-04 NOTE — Progress Notes (Signed)
Left message with pam gibson ekg done 01-23-2020 to be sent to pcp dr Manuella Ghazi [facility] for evaluation

## 2020-02-04 NOTE — Progress Notes (Signed)
Faxed ekg  And chest xray donedone 01-23-2020 to dr Gillie [facility], pt has appointment [phone] at 1230 pm, fax confirmation received

## 2020-02-07 ENCOUNTER — Encounter: Payer: Self-pay | Admitting: Medical Oncology

## 2020-02-07 NOTE — Progress Notes (Signed)
Spoke with patient to let him know that [facility] is the only site for surgical COVID testing. He will also need pre-op labs and they are willing to work with the patient. He voiced understanding.

## 2020-02-07 NOTE — Progress Notes (Signed)
Patient called asking if he can get his pre-op COVID screening done closer to home. He lives in [place] and is not sure if he have it done at [facility]. I informed him I would need to call pre-op and ask. I will call him back with an update. He voiced understanding.

## 2020-02-11 NOTE — Progress Notes (Signed)
Medical clearance note mackenzie barham np received by fax dated 02-05-2020 on chart for 02-21-2020 surgery

## 2020-02-12 ENCOUNTER — Telehealth: Payer: Self-pay | Admitting: *Deleted

## 2020-02-12 NOTE — Telephone Encounter (Signed)
CALLED PATIENT TO REMIND OF LABS AND COVID TESTING FOR 02-18-20, LVM FOR A RETURN CALL

## 2020-02-14 ENCOUNTER — Other Ambulatory Visit: Payer: Self-pay

## 2020-02-14 ENCOUNTER — Encounter (HOSPITAL_BASED_OUTPATIENT_CLINIC_OR_DEPARTMENT_OTHER): Payer: Self-pay | Admitting: Urology

## 2020-02-14 NOTE — Progress Notes (Signed)
Spoke w/ via phone for pre-op interview--- PT Lab needs dos----  no             Lab results------ pt getting CBC, CMP, PT/PTT done 02-18-2020 @ 1100;  Current CXR / EKG in epic/ chart COVID test ------ 02-18-2020 @ 1205 Arrive at ------- 0730 NPO after MN NO Solid Food.  Clear liquids from MN until--- 0630 Medications to take morning of surgery ----- NONE Diabetic medication ----- n/a Patient Special Instructions ----- to do one fleet enema morning of surgery Pre-Op special Istructions ----- pt has pcp clearance dated 02-05-2020 w/ chart (this was requested by anesthesia, Konrad Felix PA, due to abnormal EKG done on 01-23-2020. Patient verbalized understanding of instructions that were given at this phone interview. Patient denies shortness of breath, chest pain, fever, cough at this phone interview.

## 2020-02-18 ENCOUNTER — Encounter (HOSPITAL_COMMUNITY)
Admission: RE | Admit: 2020-02-18 | Discharge: 2020-02-18 | Disposition: A | Discharge status: Home / Self Care | Payer: Medicare HMO | Source: Ambulatory Visit | Attending: Urology | Admitting: Urology

## 2020-02-18 ENCOUNTER — Other Ambulatory Visit (HOSPITAL_COMMUNITY): Payer: Medicare HMO

## 2020-02-18 ENCOUNTER — Other Ambulatory Visit (HOSPITAL_COMMUNITY)
Admission: RE | Admit: 2020-02-18 | Discharge: 2020-02-18 | Disposition: A | Discharge status: Home / Self Care | Payer: Medicare HMO | Source: Ambulatory Visit | Attending: Urology | Admitting: Urology

## 2020-02-18 ENCOUNTER — Other Ambulatory Visit: Payer: Self-pay

## 2020-02-18 DIAGNOSIS — Z01812 Encounter for preprocedural laboratory examination: Secondary | ICD-10-CM | POA: Diagnosis present

## 2020-02-18 DIAGNOSIS — Z20822 Contact with and (suspected) exposure to covid-19: Secondary | ICD-10-CM | POA: Diagnosis not present

## 2020-02-18 LAB — CBC
HCT: 42.9 % (ref 39.0–52.0)
Hemoglobin: 15.1 g/dL (ref 13.0–17.0)
MCH: 32.4 pg (ref 26.0–34.0)
MCHC: 35.2 g/dL (ref 30.0–36.0)
MCV: 92.1 fL (ref 80.0–100.0)
Platelets: 283 10*3/uL (ref 150–400)
RBC: 4.66 MIL/uL (ref 4.22–5.81)
RDW: 13.4 % (ref 11.5–15.5)
WBC: 9.2 10*3/uL (ref 4.0–10.5)
nRBC: 0 % (ref 0.0–0.2)

## 2020-02-18 LAB — COMPREHENSIVE METABOLIC PANEL
ALT: 28 U/L (ref 0–44)
AST: 19 U/L (ref 15–41)
Albumin: 4.1 g/dL (ref 3.5–5.0)
Alkaline Phosphatase: 53 U/L (ref 38–126)
Anion gap: 10 (ref 5–15)
BUN: 20 mg/dL (ref 8–23)
CO2: 26 mmol/L (ref 22–32)
Calcium: 9.6 mg/dL (ref 8.9–10.3)
Chloride: 103 mmol/L (ref 98–111)
Creatinine, Ser: 0.92 mg/dL (ref 0.61–1.24)
GFR, Estimated: >60 mL/min (ref >60)
Glucose, Bld: 98 mg/dL (ref 70–99)
Potassium: 4.3 mmol/L (ref 3.5–5.1)
Sodium: 139 mmol/L (ref 135–145)
Total Bilirubin: 0.3 mg/dL (ref 0.3–1.2)
Total Protein: 7.2 g/dL (ref 6.5–8.1)

## 2020-02-18 LAB — APTT: aPTT: 28 seconds (ref 24–36)

## 2020-02-18 LAB — PROTIME-INR
INR: 1.1 (ref 0.8–1.2)
Prothrombin Time: 13.4 seconds (ref 11.4–15.2)

## 2020-02-19 LAB — SARS CORONAVIRUS 2 (TAT 6-24 HRS): SARS Coronavirus 2: NEGATIVE

## 2020-02-20 ENCOUNTER — Telehealth: Payer: Self-pay | Admitting: *Deleted

## 2020-02-20 NOTE — Telephone Encounter (Signed)
CALLED PATIENT TO REMIND OF PROCEDURE FOR 02-21-20, SPOKE WITH PATIENT AND HE IS AWARE OF THIS PROCEDURE

## 2020-02-21 ENCOUNTER — Ambulatory Visit (HOSPITAL_BASED_OUTPATIENT_CLINIC_OR_DEPARTMENT_OTHER): Payer: Medicare HMO | Admitting: Physician Assistant

## 2020-02-21 ENCOUNTER — Encounter (HOSPITAL_BASED_OUTPATIENT_CLINIC_OR_DEPARTMENT_OTHER): Payer: Self-pay | Admitting: Urology

## 2020-02-21 ENCOUNTER — Ambulatory Visit (HOSPITAL_COMMUNITY): Payer: Medicare HMO

## 2020-02-21 ENCOUNTER — Ambulatory Visit (HOSPITAL_BASED_OUTPATIENT_CLINIC_OR_DEPARTMENT_OTHER)
Admission: RE | Admit: 2020-02-21 | Discharge: 2020-02-21 | Disposition: A | Discharge status: Home / Self Care | Payer: Medicare HMO | Source: Ambulatory Visit | Attending: Urology | Admitting: Urology

## 2020-02-21 ENCOUNTER — Encounter (HOSPITAL_BASED_OUTPATIENT_CLINIC_OR_DEPARTMENT_OTHER)
Admission: RE | Disposition: A | Discharge status: Home / Self Care | Payer: Self-pay | Source: Ambulatory Visit | Attending: Urology

## 2020-02-21 ENCOUNTER — Ambulatory Visit (HOSPITAL_BASED_OUTPATIENT_CLINIC_OR_DEPARTMENT_OTHER): Payer: Medicare HMO | Admitting: Anesthesiology

## 2020-02-21 DIAGNOSIS — Z91014 Allergy to mammalian meats: Secondary | ICD-10-CM | POA: Diagnosis not present

## 2020-02-21 DIAGNOSIS — C61 Malignant neoplasm of prostate: Secondary | ICD-10-CM | POA: Diagnosis present

## 2020-02-21 DIAGNOSIS — F17210 Nicotine dependence, cigarettes, uncomplicated: Secondary | ICD-10-CM | POA: Insufficient documentation

## 2020-02-21 HISTORY — DX: Nocturia: R35.1

## 2020-02-21 HISTORY — PX: SPACE OAR INSTILLATION: SHX6769

## 2020-02-21 HISTORY — PX: CYSTOSCOPY: SHX5120

## 2020-02-21 HISTORY — PX: RADIOACTIVE SEED IMPLANT: SHX5150

## 2020-02-21 HISTORY — DX: Unspecified osteoarthritis, unspecified site: M19.90

## 2020-02-21 SURGERY — INSERTION, RADIATION SOURCE, PROSTATE
Anesthesia: General | Site: Rectum

## 2020-02-21 MED ORDER — PROPOFOL 10 MG/ML IV BOLUS
INTRAVENOUS | Status: DC | PRN
  Administered 2020-02-21: 150 mg via INTRAVENOUS
  Administered 2020-02-21: 10 mg via INTRAVENOUS
  Administered 2020-02-21: 20 mg via INTRAVENOUS

## 2020-02-21 MED ORDER — FENTANYL CITRATE (PF) 250 MCG/5ML IJ SOLN
INTRAMUSCULAR | Status: AC
  Filled 2020-02-21: qty 5

## 2020-02-21 MED ORDER — CIPROFLOXACIN IN D5W 400 MG/200ML IV SOLN
INTRAVENOUS | Status: AC
  Filled 2020-02-21: qty 200

## 2020-02-21 MED ORDER — PROMETHAZINE HCL 25 MG/ML IJ SOLN: 6.2500 mg | INTRAMUSCULAR | Status: DC | PRN

## 2020-02-21 MED ORDER — CIPROFLOXACIN IN D5W 400 MG/200ML IV SOLN
400.0000 mg | INTRAVENOUS | Status: AC
  Administered 2020-02-21: 10:00:00 400 mg via INTRAVENOUS

## 2020-02-21 MED ORDER — PROPOFOL 10 MG/ML IV BOLUS
INTRAVENOUS | Status: AC
  Filled 2020-02-21: qty 20

## 2020-02-21 MED ORDER — MORPHINE SULFATE (PF) 4 MG/ML IV SOLN: 2.0000 mg | INTRAVENOUS | Status: DC | PRN

## 2020-02-21 MED ORDER — ACETAMINOPHEN 500 MG PO TABS
ORAL_TABLET | ORAL | Status: AC
  Filled 2020-02-21: qty 2

## 2020-02-21 MED ORDER — SODIUM CHLORIDE 0.9% FLUSH: 3.0000 mL | Freq: Two times a day (BID) | INTRAVENOUS | Status: DC

## 2020-02-21 MED ORDER — OXYCODONE HCL 5 MG PO TABS: 5.0000 mg | ORAL_TABLET | Freq: Once | ORAL | Status: DC | PRN

## 2020-02-21 MED ORDER — SODIUM CHLORIDE 0.9 % IV SOLN: 250.0000 mL | INTRAVENOUS | Status: DC | PRN

## 2020-02-21 MED ORDER — FENTANYL CITRATE (PF) 250 MCG/5ML IJ SOLN
INTRAMUSCULAR | Status: DC | PRN
  Administered 2020-02-21: 50 ug via INTRAVENOUS
  Administered 2020-02-21 (×3): 25 ug via INTRAVENOUS

## 2020-02-21 MED ORDER — SODIUM CHLORIDE 0.9 % IR SOLN
Status: DC | PRN
  Administered 2020-02-21: 200 mL

## 2020-02-21 MED ORDER — FLEET ENEMA 7-19 GM/118ML RE ENEM: 1.0000 | ENEMA | Freq: Once | RECTAL | Status: DC

## 2020-02-21 MED ORDER — LIDOCAINE HCL (PF) 2 % IJ SOLN
INTRAMUSCULAR | Status: AC
  Filled 2020-02-21: qty 5

## 2020-02-21 MED ORDER — SODIUM CHLORIDE 0.9% FLUSH: 3.0000 mL | INTRAVENOUS | Status: DC | PRN

## 2020-02-21 MED ORDER — DEXAMETHASONE SODIUM PHOSPHATE 10 MG/ML IJ SOLN
INTRAMUSCULAR | Status: DC | PRN
  Administered 2020-02-21: 10 mg via INTRAVENOUS

## 2020-02-21 MED ORDER — ONDANSETRON HCL 4 MG/2ML IJ SOLN
INTRAMUSCULAR | Status: DC | PRN
  Administered 2020-02-21: 4 mg via INTRAVENOUS

## 2020-02-21 MED ORDER — ACETAMINOPHEN 500 MG PO TABS
1000.0000 mg | ORAL_TABLET | Freq: Once | ORAL | Status: AC
  Administered 2020-02-21: 08:00:00 1000 mg via ORAL

## 2020-02-21 MED ORDER — HYDROCODONE-ACETAMINOPHEN 5-325 MG PO TABS: 1.0000 | ORAL_TABLET | Freq: Four times a day (QID) | ORAL | 0 refills | Status: AC | PRN

## 2020-02-21 MED ORDER — STERILE WATER FOR IRRIGATION IR SOLN
Status: DC | PRN
  Administered 2020-02-21: 3 mL

## 2020-02-21 MED ORDER — ACETAMINOPHEN 325 MG PO TABS: 650.0000 mg | ORAL_TABLET | ORAL | Status: DC | PRN

## 2020-02-21 MED ORDER — SODIUM CHLORIDE (PF) 0.9 % IJ SOLN
INTRAMUSCULAR | Status: DC | PRN
  Administered 2020-02-21: 10 mL

## 2020-02-21 MED ORDER — HYDROMORPHONE HCL 1 MG/ML IJ SOLN: 0.2500 mg | INTRAMUSCULAR | Status: DC | PRN

## 2020-02-21 MED ORDER — OXYCODONE HCL 5 MG/5ML PO SOLN: 5.0000 mg | Freq: Once | ORAL | Status: DC | PRN

## 2020-02-21 MED ORDER — OXYCODONE HCL 5 MG PO TABS: 5.0000 mg | ORAL_TABLET | ORAL | Status: DC | PRN

## 2020-02-21 MED ORDER — ONDANSETRON HCL 4 MG/2ML IJ SOLN
INTRAMUSCULAR | Status: AC
  Filled 2020-02-21: qty 2

## 2020-02-21 MED ORDER — LACTATED RINGERS IV SOLN: INTRAVENOUS | Status: DC

## 2020-02-21 MED ORDER — LIDOCAINE HCL (CARDIAC) PF 100 MG/5ML IV SOSY
PREFILLED_SYRINGE | INTRAVENOUS | Status: DC | PRN
  Administered 2020-02-21: 60 mg via INTRATRACHEAL

## 2020-02-21 MED ORDER — DEXAMETHASONE SODIUM PHOSPHATE 10 MG/ML IJ SOLN
INTRAMUSCULAR | Status: AC
  Filled 2020-02-21: qty 1

## 2020-02-21 MED ORDER — ACETAMINOPHEN 325 MG RE SUPP: 650.0000 mg | RECTAL | Status: DC | PRN

## 2020-02-21 MED ORDER — IOHEXOL 300 MG/ML  SOLN
INTRAMUSCULAR | Status: DC | PRN
  Administered 2020-02-21: 10:00:00 7 mL

## 2020-02-21 MED ORDER — MIDAZOLAM HCL 2 MG/2ML IJ SOLN
INTRAMUSCULAR | Status: AC
  Filled 2020-02-21: qty 2

## 2020-02-21 SURGICAL SUPPLY — 41 items
BAG DRN RND TRDRP ANRFLXCHMBR (UROLOGICAL SUPPLIES) ×3
BAG URINE DRAIN 2000ML AR STRL (UROLOGICAL SUPPLIES) ×5 IMPLANT
BLADE CLIPPER SENSICLIP SURGIC (BLADE) ×5 IMPLANT
CATH FOLEY 2WAY SLVR  5CC 16FR (CATHETERS) ×4
CATH FOLEY 2WAY SLVR 5CC 16FR (CATHETERS) ×3 IMPLANT
CATH ROBINSON RED A/P 16FR (CATHETERS) IMPLANT
CATH ROBINSON RED A/P 20FR (CATHETERS) ×5 IMPLANT
CLOTH BEACON ORANGE TIMEOUT ST (SAFETY) ×5 IMPLANT
CNTNR URN SCR LID CUP LEK RST (MISCELLANEOUS) ×6 IMPLANT
CONT SPEC 4OZ STRL OR WHT (MISCELLANEOUS) ×10
COVER BACK TABLE 60X90IN (DRAPES) ×5 IMPLANT
COVER MAYO STAND STRL (DRAPES) ×5 IMPLANT
DRAPE C-ARM 35X43 STRL (DRAPES) IMPLANT
DRSG TEGADERM 4X4.75 (GAUZE/BANDAGES/DRESSINGS) ×5 IMPLANT
DRSG TEGADERM 8X12 (GAUZE/BANDAGES/DRESSINGS) ×10 IMPLANT
GAUZE SPONGE 4X4 12PLY STRL LF (GAUZE/BANDAGES/DRESSINGS) ×5 IMPLANT
GLOVE BIO SURGEON STRL SZ 6.5 (GLOVE) ×4 IMPLANT
GLOVE BIO SURGEON STRL SZ7.5 (GLOVE) IMPLANT
GLOVE BIO SURGEON STRL SZ8 (GLOVE) ×5 IMPLANT
GLOVE BIO SURGEONS STRL SZ 6.5 (GLOVE) ×1
GLOVE SURG ORTHO 8.5 STRL (GLOVE) ×15 IMPLANT
GLOVE SURG SS PI 6.5 STRL IVOR (GLOVE) IMPLANT
GLOVE SURG SS PI 8.0 STRL IVOR (GLOVE) ×5 IMPLANT
GOWN STRL REUS W/ TWL XL LVL3 (GOWN DISPOSABLE) ×3 IMPLANT
GOWN STRL REUS W/TWL XL LVL3 (GOWN DISPOSABLE) ×10 IMPLANT
HOLDER FOLEY CATH W/STRAP (MISCELLANEOUS) IMPLANT
I-Seed Agx100 ×250 IMPLANT
IMPL SPACEOAR VUE SYSTEM (Spacer) ×3 IMPLANT
IMPLANT SPACEOAR VUE SYSTEM (Spacer) ×5 IMPLANT
IV NS 1000ML (IV SOLUTION) ×5
IV NS 1000ML BAXH (IV SOLUTION) ×3 IMPLANT
KIT TURNOVER CYSTO (KITS) ×5 IMPLANT
MARKER SKIN DUAL TIP RULER LAB (MISCELLANEOUS) ×5 IMPLANT
PACK CYSTO (CUSTOM PROCEDURE TRAY) ×5 IMPLANT
SURGILUBE 2OZ TUBE FLIPTOP (MISCELLANEOUS) ×5 IMPLANT
SUT BONE WAX W31G (SUTURE) IMPLANT
SYR 10ML LL (SYRINGE) ×10 IMPLANT
TOWEL OR 17X26 10 PK STRL BLUE (TOWEL DISPOSABLE) ×5 IMPLANT
UNDERPAD 30X36 HEAVY ABSORB (UNDERPADS AND DIAPERS) ×10 IMPLANT
WATER STERILE IRR 3000ML UROMA (IV SOLUTION) IMPLANT
WATER STERILE IRR 500ML POUR (IV SOLUTION) ×5 IMPLANT

## 2020-02-21 NOTE — Anesthesia Procedure Notes (Signed)
Procedure Name: LMA Insertion Date/Time: 02/21/2020 9:36 AM Performed by: Rogers Blocker, CRNA Pre-anesthesia Checklist: Patient identified, Emergency Drugs available, Suction available and Patient being monitored Patient Re-evaluated:Patient Re-evaluated prior to induction Oxygen Delivery Method: Circle System Utilized Preoxygenation: Pre-oxygenation with 100% oxygen Induction Type: IV induction Ventilation: Mask ventilation without difficulty LMA: LMA inserted LMA Size: 5.0 Number of attempts: 1 Placement Confirmation: positive ETCO2 Tube secured with: Tape Dental Injury: Teeth and Oropharynx as per pre-operative assessment

## 2020-02-21 NOTE — Progress Notes (Signed)
°  Radiation Oncology         [phone] ________________________________  Name: Andrew Gordon MRN: [id number]  Date: 02/21/2020  DOB: 01/24/1951       Prostate Seed Implant  [id number], [name]  No ref. provider found  DIAGNOSIS: 69 y.o. gentleman with Stage T2a adenocarcinoma of the prostate with Gleason score of 4+3, and PSA of 6.6.  Oncology History  Malignant neoplasm of prostate ([place])  11/09/2019 Cancer Staging   Staging form: Prostate, AJCC 8th Edition - Clinical stage from 11/09/2019: Stage IIC (cT2a, cN0, cM0, PSA: 6.6, Grade Group: 3) - Signed by Freeman Caldron, PA-C on 01/23/2020   11/27/2019 Initial Diagnosis   Malignant neoplasm of prostate ([facility])     No diagnosis found.  PROCEDURE: Insertion of radioactive I-125 seeds into the prostate gland.  RADIATION DOSE: 145 Gy, definitive therapy.  TECHNIQUE: Andrew Gordon was brought to the operating room with the urologist. He was placed in the dorsolithotomy position. He was catheterized and a rectal tube was inserted. The perineum was shaved, prepped and draped. The ultrasound probe was then introduced into the rectum to see the prostate gland.  TREATMENT DEVICE: A needle grid was attached to the ultrasound probe stand and anchor needles were placed.  3D PLANNING: The prostate was imaged in 3D using a sagittal sweep of the prostate probe. These images were transferred to the planning computer. There, the prostate, urethra and rectum were defined on each axial reconstructed image. Then, the software created an optimized 3D plan and a few seed positions were adjusted. The quality of the plan was reviewed using [facility] information for the target and the following two organs at risk:  Urethra and Rectum.  Then the accepted plan was printed and handed off to the radiation therapist.  Under my supervision, the custom loading of the seeds and spacers was carried out and loaded into sealed vicryl sleeves.  These pre-loaded  needles were then placed into the needle holder.Marland Kitchen  PROSTATE VOLUME STUDY:  Using transrectal ultrasound the volume of the prostate was verified to be 30.4 cc.  SPECIAL TREATMENT PROCEDURE/SUPERVISION AND HANDLING: The pre-loaded needles were then delivered under sagittal guidance. A total of 18 needles were used to deposit 50 seeds in the prostate gland. The individual seed activity was 0.531 mCi.  SpaceOAR:  Yes  COMPLEX SIMULATION: At the end of the procedure, an anterior radiograph of the pelvis was obtained to document seed positioning and count. Cystoscopy was performed to check the urethra and bladder.  MICRODOSIMETRY: At the end of the procedure, the patient was emitting 0.055 mR/hr at 1 meter. Accordingly, he was considered safe for hospital discharge.  PLAN: The patient will return to the radiation oncology clinic for post implant CT dosimetry in three weeks.   ________________________________  Sheral Apley Tammi Klippel, M.D.

## 2020-02-21 NOTE — Op Note (Signed)
PATIENT:  Andrew Gordon  PRE-OPERATIVE DIAGNOSIS:  Adenocarcinoma of the prostate  POST-OPERATIVE DIAGNOSIS:  Same  PROCEDURE:  Procedure(s): 1. I-125 radioactive seed implantation 2. SpaceOAR implantation. 3.  Cystoscopy  SURGEON:  Surgeon(s): Irine Seal MD  Radiation oncologist: Dr. Tyler Pita  ANESTHESIA:  General  EBL:  None  DRAINS: 61 French Foley catheter  INDICATION: [name] is a 69 y.o. with Stage T2a N0 M0, Gleason 7(4+3) prostate cancer who has elected brachytherapy with adjuvant ADT for treatment.  Description of procedure: After informed consent the patient was brought to the major OR, placed on the table and administered general anesthesia. He was then moved to the modified lithotomy position with his perineum perpendicular to the floor. His perineum and genitalia were then sterilely prepped. An official timeout was then performed. A 16 French Foley catheter was then placed in the bladder and filled with dilute contrast, a rectal tube was placed in the rectum and the transrectal ultrasound probe was placed in the rectum and affixed to the stand. He was then sterilely draped.  The sterile grid was installed.   Anchor needles were then placed.   Real time ultrasonography was used along with the seed planning software spot-pro version 3.1-00. This was used to develop the seed plan including the number of needles as well as number of seeds required for complete and adequate coverage. Real-time ultrasonography was then used along with the previously developed plan  to implant a total of 50 seeds using 18 needles for a target dose of 145 Gy. This proceeded without difficulty or complication.  The anchor needles and guide were removed and the SpaceOAR needle was passed under US guidance into the fat stripe posterior to the prostate with the tip in the midline at mid prostate. A puff of NS confirmed appropriate positioning and the SpaceOAR polymer was then  injected over 10 seconds into the space with excellent distribution.     A Foley catheter was then removed as well as the transrectal ultrasound probe and rectal probe. Flexible cystoscopy was then performed using the 17 French flexible scope which revealed a normal urethra throughout its length down to the sphincter which appeared intact. The prostatic urethra was 2-3cm with bilobar hyperplasia with some coaptation. The bladder was then entered and fully and systematically inspected.  The ureteral orifices were noted to be of normal configuration and position. The mucosa revealed no evidence of tumors. There were also no stones identified within the bladder.  No seeds or spacers were seen and/or removed from the bladder.  The cystoscope was then removed.  The drapes were removed.  The perineum was cleaned and dressed.  He was taken out of the lithotomy position and was awakened and taken to recovery room in stable and satisfactory condition. He tolerated procedure well and there were no intraoperative complications.

## 2020-02-21 NOTE — Discharge Instructions (Signed)
Brachytherapy for Prostate Cancer, Care After  This sheet gives you information about how to care for yourself after your procedure. Your health care provider may also give you more specific instructions. If you have problems or questions, contact your health care provider. What can I expect after the procedure? After the procedure, it is common to have:  Trouble passing urine.  Blood in the urine or semen.  Constipation.  Frequent feeling of an urgent need to urinate.  Bruising, swelling, and tenderness of the area behind the scrotum (perineum).  Bloating and gas.  Fatigue.  Burning or pain in the rectum.  Problems getting or keeping an erection (erectile dysfunction).  Nausea. Follow these instructions at home: Managing pain, stiffness, and swelling  If directed, apply ice to the affected area: ? Put ice in a plastic bag. ? Place a towel between your skin and the bag. ? Leave the ice on for 20 minutes, 2-3 times a day.  Try not to sit directly on the area behind the scrotum. A soft cushion can help with discomfort. Activity  Do not drive for 24 hours if you were given a medicine to help you relax (sedative).  Do not drive or use heavy machinery while taking prescription pain medicine.  Rest as told by your health care provider.  Most people can return to normal activities a few days or weeks after the procedure. Ask your health care provider what activities are safe for you. Eating and drinking  Drink enough fluid to keep your urine clear or pale yellow.  Eat a healthy, balanced diet. This includes lean proteins, whole grains, and plenty of fruits and vegetables. General instructions  Take over-the-counter and prescription medicines only as told by your health care provider.  Keep all follow-up visits as told by your health care provider. This is important. You may still need additional treatment.  Do not take baths, swim, or use a hot tub until your health  care provider approves. Shower and wash the area behind the scrotum gently.  Do not have sex for one week after the treatment, or until your health care provider approves.  If you have permanent, low-dose brachytherapy implants: ? Limit close contact with children and pregnant women for 2 months or as told by your health care provider. This is important because of the radiation that is still active in the prostate. ? You may set off radioactive sensors, such as airport screenings. Ask your health care provider for a document that explains your treatment. ? You may be instructed to use a condom during sex for the first 2 months after low-dose brachytherapy. Contact a health care provider if:  You have a fever or chills.  You do not have a bowel movement for 3-4 days after the procedure.  You have diarrhea for 3-4 days after the procedure.  You develop any new symptoms, such as problems with urinating or erectile dysfunction.  You have abdomen (abdominal) pain.  You have more blood in your urine. Get help right away if:  You cannot urinate.  There is excessive bleeding from your rectum.  You have unusual drainage coming from your rectum.  You have severe pain in the treated area that does not go away with pain medicine.  You have severe nausea or vomiting. Summary  If you have permanent, low-dose brachytherapy implants, limit close contact with children and pregnant women for 2 months or as told by your health care provider. This is important because of the radiation  that is still active in the prostate.  Talk with your health care provider about your risk of brachytherapy side effects, such as erectile dysfunction or urinary problems. Your health care provider will be able to recommend possible treatment options.  Keep all follow-up visits as told by your health care provider. This is important. You may need additional treatment. This information is not intended to replace  advice given to you by your health care provider. Make sure you discuss any questions you have with your health care provider. Document Revised: 02/04/2017 Document Reviewed: 03/26/2016 Elsevier Patient Education  [place] Instructions  Activity: Get plenty of rest for the remainder of the day. A responsible individual must stay with you for 24 hours following the procedure.  For the next 24 hours, DO NOT: -Drive a car -Paediatric nurse -Drink alcoholic beverages -Take any medication unless instructed by your physician -Make any legal decisions or sign important papers.  Meals: Start with liquid foods such as gelatin or soup. Progress to regular foods as tolerated. Avoid greasy, spicy, heavy foods. If nausea and/or vomiting occur, drink only clear liquids until the nausea and/or vomiting subsides. Call your physician if vomiting continues.  Special Instructions/Symptoms: Your throat may feel dry or sore from the anesthesia or the breathing tube placed in your throat during surgery. If this causes discomfort, gargle with warm salt water. The discomfort should disappear within 24 hours.  If you had a scopolamine patch placed behind your ear for the management of post- operative nausea and/or vomiting:  1. The medication in the patch is effective for 72 hours, after which it should be removed.  Wrap patch in a tissue and discard in the trash. Wash hands thoroughly with soap and water. 2. You may remove the patch earlier than 72 hours if you experience unpleasant side effects which may include dry mouth, dizziness or visual disturbances. 3. Avoid touching the patch. Wash your hands with soap and water after contact with the patch.

## 2020-02-21 NOTE — Transfer of Care (Signed)
Immediate Anesthesia Transfer of Care Note  Patient: Briggs Edelen Pelzel  Procedure(s) Performed: RADIOACTIVE SEED IMPLANT/BRACHYTHERAPY IMPLANT (N/A Prostate) SPACE OAR INSTILLATION (N/A Rectum) CYSTOSCOPY FLEXIBLE  Patient Location: PACU  Anesthesia Type:General  Level of Consciousness: awake, alert , oriented and patient cooperative  Airway & Oxygen Therapy: Patient Spontanous Breathing and Patient connected to face mask oxygen  Post-op Assessment: Report given to RN and Post -op Vital signs reviewed and stable  Post vital signs: Reviewed and stable  Last Vitals:  Vitals Value Taken Time  BP 137/78 02/21/20 1047  Temp    Pulse 90 02/21/20 1048  Resp 17 02/21/20 1048  SpO2 92 % 02/21/20 1048  Vitals shown include unvalidated device data.  Last Pain:  Vitals:   02/21/20 0755  TempSrc: Oral  PainSc: 0-No pain      Patients Stated Pain Goal: 4 (69/86/14 8307)  Complications: No complications documented.

## 2020-02-21 NOTE — H&P (Signed)
Subjective:  02/21/20: Andrew Gordon returns today for a seed implant for further management of his recently diagnosed prostate cancer.  He is doing well without new complaints.   Andrew Gordon returns today in f/u for continuation of ADT with Lupron after his initial [place] a month ago.  He had no hot flashes.  His IPSS is 2.   He is to have a Seed implant on 02/21/20.  His prostate biopsy on 11/09/19 that demonstrated 5/6 Right and 1/6 left cores with cancer.  2 on the left with GG3 and the remainder with GG2.  The prostate volume was 46.8ml.   He has T2a N0 M0 with a right base nodule and a negative CT and [place].   Andrew Gordon is a 69 yo male who is sent in consultation by Dr. Monico Blitz for an elevated PSA that was 6.5 in May and 6.6. in June with an 11% f/t ratio.  He has no other GU history.   He has had prior normal PSA's in [place] in the past.  His IPSS is 3.    ROS:  Review of Systems  All other systems reviewed and are negative.   Allergies  Allergen Reactions  . Beef-Derived Products Hives  . Pork-Derived Metallurgist    Past Medical History:  Diagnosis Date  . Arthritis    hands  . Hypertension    followed by pcp  . Lyme disease 2010   per pt dx 2010 after tick bite, stated had mild symptoms that resolved other than since then gets hive when eats beef / pork  . Nocturia   . Prostate cancer [facility]) urologist--- dr Lamiracle Chaidez/  oncologist--- dr Tammi Klippel   dx 11-09-2019,  Stage T2a, Gleason 4+3    Past Surgical History:  Procedure Laterality Date  . NO PAST SURGERIES    . PROSTATE BIOPSY      Social History   Socioeconomic History  . Marital status: Widowed    Spouse name: Not on file  . Number of children: 1  . Years of education: Not on file  . Highest education level: Not on file  Occupational History  . Occupation: retired  Tobacco Use  . Smoking status: Current Every Day Smoker    Packs/day: 0.25    Years: 30.00    Pack years: 7.50    Types: Cigarettes  . Smokeless  tobacco: Never Used  . Tobacco comment: 12-09-20212 per pt 5 cig per day  Vaping Use  . Vaping Use: Never used  Substance and Sexual Activity  . Alcohol use: Not Currently  . Drug use: Never  . Sexual activity: Not Currently    Comment: widowed. no interested.   Other Topics Concern  . Not on file  Social History Narrative  . Not on file   Social Determinants of Health   Financial Resource Strain: Not on file  Food Insecurity: Not on file  Transportation Needs: Not on file  Physical Activity: Not on file  Stress: Not on file  Social Connections: Not on file  Intimate Partner Violence: Not on file    Family History  Problem Relation Age of Onset  . Breast cancer Mother   . Breast cancer Paternal Aunt   . Prostate cancer Neg Hx   . Pancreatic cancer Neg Hx   . Colon cancer Neg Hx     Anti-infectives: Anti-infectives (From admission, onward)   Start     Dose/Rate Route Frequency Ordered Stop   02/21/20 0743  ciprofloxacin (CIPRO)  IVPB 400 mg        400 mg 200 mL/hr over 60 Minutes Intravenous 60 min pre-op 02/21/20 0743        Current Facility-Administered Medications  Medication Dose Route Frequency Provider Last Rate Last Admin  . ciprofloxacin (CIPRO) IVPB 400 mg  400 mg Intravenous 60 min Pre-Op Irine Seal, MD      . lactated ringers infusion   Intravenous Continuous Myrtie Soman, MD 50 mL/hr at 02/21/20 0805 New Bag at 02/21/20 0805  . [START ON 02/22/2020] sodium phosphate (FLEET) 7-19 GM/118ML enema 1 enema  1 enema Rectal Once Irine Seal, MD         Objective: Vital signs in last 24 hours: BP (!) 146/78   Pulse 79   Temp (!) 96.8 F (36 C) (Oral)   Resp 18   Ht 6\' 1"  (1.854 m)   Wt 129.1 kg   SpO2 98%   BMI 37.56 kg/m   Intake/Output from previous day: No intake/output data recorded. Intake/Output this shift: @IOTHISSHIFT @   Physical Exam Constitutional:      Appearance: Normal appearance. He is obese.  Cardiovascular:     Rate and  Rhythm: Normal rate and regular rhythm.     Pulses: Normal pulses.  Pulmonary:     Effort: Pulmonary effort is normal. No respiratory distress.     Breath sounds: Normal breath sounds.  Neurological:     Mental Status: He is alert.     Lab Results:  No results found for this or any previous visit (from the past 24 hour(s)).  BMET Recent Labs    02/18/20 1121  NA 139  K 4.3  CL 103  CO2 26  GLUCOSE 98  BUN 20  CREATININE 0.92  CALCIUM 9.6   PT/INR Recent Labs    02/18/20 1121  LABPROT 13.4  INR 1.1   ABG No results for input(s): PHART, HCO3 in the last 72 hours.  Invalid input(s): PCO2, PO2  Studies/Results: Prostate Bx results reviewed.  DG Chest 2 View  Result Date: 01/24/2020 CLINICAL DATA:  Preop for radioactive seed implant. EXAM: CHEST - 2 VIEW COMPARISON:  None. FINDINGS: Upper normal heart size. There is mild aortic tortuosity. Mild hyperinflation and bronchial thickening. No focal airspace disease. No pulmonary mass or evidence of nodule. No pleural fluid or pneumothorax. Mild thoracic spondylosis. No acute osseous abnormalities are seen. IMPRESSION: Mild hyperinflation and bronchial thickening, may represent bronchitis, asthma, or related smoking. Electronically Signed   By: Keith Rake M.D.   On: 01/24/2020 11:23     Assessment/Plan: 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   He has elected ADT and a seed implant.  He was given Lupron 45mg  IM today and will f/u in 4 weeks for a post op visit following the seed implant.              CC: Dr. Monico Blitz.    Irine Seal 02/21/2020 [phone] ID: Andrew Gordon, male   DOB: 29-May-1950, 69 y.o.   MRN: [id number]

## 2020-02-21 NOTE — Anesthesia Postprocedure Evaluation (Signed)
Anesthesia Post Note  Patient: Andrew Gordon  Procedure(s) Performed: RADIOACTIVE SEED IMPLANT/BRACHYTHERAPY IMPLANT (N/A Prostate) SPACE OAR INSTILLATION (N/A Rectum) CYSTOSCOPY FLEXIBLE     Patient location during evaluation: PACU Anesthesia Type: General Level of consciousness: awake and alert, oriented and patient cooperative Pain management: pain level controlled Vital Signs Assessment: post-procedure vital signs reviewed and stable Respiratory status: spontaneous breathing, nonlabored ventilation and respiratory function stable Cardiovascular status: blood pressure returned to baseline and stable Postop Assessment: no apparent nausea or vomiting Anesthetic complications: no   No complications documented.  Last Vitals:  Vitals:   02/21/20 1045 02/21/20 1100  BP: 137/78 (!) 144/93  Pulse: 88 83  Resp: 16 14  Temp: 36.4 C   SpO2: 95% 97%    Last Pain:  Vitals:   02/21/20 1130  TempSrc:   PainSc: 0-No pain                 Pervis Hocking

## 2020-02-21 NOTE — Anesthesia Preprocedure Evaluation (Addendum)
Anesthesia Evaluation  Patient identified by MRN, date of birth, ID band Patient awake    Reviewed: Allergy & Precautions, NPO status , Patient's Chart, lab work & pertinent test results  Airway Mallampati: II  TM Distance: >3 FB Neck ROM: Full    Dental  (+) Upper Dentures, Partial Lower   Pulmonary Current Smoker,    Pulmonary exam normal breath sounds clear to auscultation       Cardiovascular hypertension, Pt. on medications Normal cardiovascular exam Rhythm:Regular Rate:Normal     Neuro/Psych negative neurological ROS  negative psych ROS   GI/Hepatic negative GI ROS, Neg liver ROS,   Endo/Other  Obesity BMI 38  Renal/GU negative Renal ROS  negative genitourinary   Musculoskeletal  (+) Arthritis , Osteoarthritis,    Abdominal   Peds  Hematology negative hematology ROS (+) hct 42.9, plt 283   Anesthesia Other Findings Prostate ca   Reproductive/Obstetrics negative OB ROS                            Anesthesia Physical Anesthesia Plan  ASA: II  Anesthesia Plan: General   Post-op Pain Management:    Induction: Intravenous  PONV Risk Score and Plan: 1 and Ondansetron, Dexamethasone and Treatment may vary due to age or medical condition  Airway Management Planned: LMA  Additional Equipment: None  Intra-op Plan:   Post-operative Plan: Extubation in OR  Informed Consent: I have reviewed the patients History and Physical, chart, labs and discussed the procedure including the risks, benefits and alternatives for the proposed anesthesia with the patient or authorized representative who has indicated his/her understanding and acceptance.     Dental advisory given  Plan Discussed with: CRNA  Anesthesia Plan Comments:        Anesthesia Quick Evaluation

## 2020-02-22 ENCOUNTER — Encounter (HOSPITAL_BASED_OUTPATIENT_CLINIC_OR_DEPARTMENT_OTHER): Payer: Self-pay | Admitting: Urology

## 2020-03-14 ENCOUNTER — Telehealth: Payer: Self-pay | Admitting: *Deleted

## 2020-03-14 NOTE — Telephone Encounter (Signed)
Returned patient's phone call, lvm for a return call 

## 2020-03-17 ENCOUNTER — Telehealth: Payer: Self-pay | Admitting: *Deleted

## 2020-03-17 NOTE — Telephone Encounter (Signed)
Returned patient's phone call, spoke with patient 

## 2020-03-19 ENCOUNTER — Telehealth: Payer: Self-pay | Admitting: *Deleted

## 2020-03-19 NOTE — Telephone Encounter (Signed)
Called patient to remind of post seed appts. for 03-20-20, lvm for a return call

## 2020-03-20 ENCOUNTER — Encounter: Payer: Self-pay | Admitting: Medical Oncology

## 2020-03-20 ENCOUNTER — Other Ambulatory Visit: Payer: Self-pay

## 2020-03-20 ENCOUNTER — Encounter: Payer: Self-pay | Admitting: Urology

## 2020-03-20 ENCOUNTER — Ambulatory Visit
Admission: RE | Admit: 2020-03-20 | Discharge: 2020-03-20 | Disposition: A | Discharge status: Home / Self Care | Payer: Medicare HMO | Source: Ambulatory Visit | Attending: Radiation Oncology | Admitting: Radiation Oncology

## 2020-03-20 ENCOUNTER — Ambulatory Visit
Admission: RE | Admit: 2020-03-20 | Discharge: 2020-03-20 | Disposition: A | Discharge status: Home / Self Care | Payer: Medicare HMO | Source: Ambulatory Visit | Attending: Urology | Admitting: Urology

## 2020-03-20 ENCOUNTER — Ambulatory Visit (INDEPENDENT_AMBULATORY_CARE_PROVIDER_SITE_OTHER): Payer: Medicare HMO | Admitting: Urology

## 2020-03-20 VITALS — BP 128/79 | HR 85 | Temp 98.1°F | Ht 73.5 in | Wt 287.5 lb

## 2020-03-20 VITALS — BP 129/75 | HR 86 | Temp 97.5°F | Resp 20 | Ht 73.5 in | Wt 289.0 lb

## 2020-03-20 VITALS — BP 129/75 | HR 86 | Temp 97.5°F | Resp 18 | Wt 289.0 lb

## 2020-03-20 DIAGNOSIS — C61 Malignant neoplasm of prostate: Secondary | ICD-10-CM | POA: Insufficient documentation

## 2020-03-20 DIAGNOSIS — R3911 Hesitancy of micturition: Secondary | ICD-10-CM | POA: Insufficient documentation

## 2020-03-20 DIAGNOSIS — Z79899 Other long term (current) drug therapy: Secondary | ICD-10-CM | POA: Insufficient documentation

## 2020-03-20 DIAGNOSIS — Z7982 Long term (current) use of aspirin: Secondary | ICD-10-CM | POA: Insufficient documentation

## 2020-03-20 DIAGNOSIS — R350 Frequency of micturition: Secondary | ICD-10-CM | POA: Diagnosis not present

## 2020-03-20 DIAGNOSIS — Z923 Personal history of irradiation: Secondary | ICD-10-CM | POA: Insufficient documentation

## 2020-03-20 LAB — URINALYSIS, ROUTINE W REFLEX MICROSCOPIC
Bilirubin, UA: NEGATIVE
Glucose, UA: NEGATIVE
Ketones, UA: NEGATIVE
Leukocytes,UA: NEGATIVE
Nitrite, UA: NEGATIVE
Protein,UA: NEGATIVE
RBC, UA: NEGATIVE
Specific Gravity, UA: 1.02 (ref 1.005–1.030)
Urobilinogen, Ur: 0.2 mg/dL (ref 0.2–1.0)
pH, UA: 6.5 (ref 5.0–7.5)

## 2020-03-20 NOTE — Progress Notes (Signed)
  Radiation Oncology         [phone]) [phone] ________________________________  Name: Andrew Gordon MRN: [id number]  Date: 03/20/2020  DOB: 01-Nov-1950  COMPLEX SIMULATION NOTE  NARRATIVE:  The patient was brought to the [facility] today following prostate seed implantation approximately one month ago.  Identity was confirmed.  All relevant records and images related to the planned course of therapy were reviewed.  Then, the patient was set-up supine.  CT images were obtained.  The CT images were loaded into the planning software.  Then the prostate and rectum were contoured.  Treatment planning then occurred.  The implanted iodine 125 seeds were identified by the physics staff for projection of radiation distribution  I have requested : 3D Simulation  I have requested a DVH of the following structures: Prostate and rectum.    ________________________________  Sheral Apley Tammi Klippel, M.D.

## 2020-03-20 NOTE — Progress Notes (Signed)
Radiation Oncology         [phone] ________________________________  Name: Andrew Gordon MRN: [id number]  Date: 03/20/2020  DOB: Aug 05, 1950  Post-Seed Follow-Up Visit Note  CC: [name]  Irine Seal, MD  Diagnosis:    70 y.o. gentleman with Stage T2a adenocarcinoma of the prostate with Gleason score of 4+3, and PSA of 6.6.    ICD-10-CM   1. Malignant neoplasm of prostate ([place])  C61     Interval Since Last Radiation:  4 weeks 02/21/20:  Insertion of radioactive I-125 seeds into the prostate gland; 145 Gy, definitive therapy with placement of SpaceOAR VUE gel.  Narrative:  The patient returns today for routine follow-up.  He is complaining of mild increased urinary frequency and urinary hesitation symptoms. He filled out a questionnaire regarding urinary function today providing and overall IPSS score of 3 characterizing his symptoms as mild with nocturia x1 and only mild residual frequency and weaker flow of stream.  He specifically denies any dysuria, gross hematuria, straining to void, incomplete bladder emptying or incontinence.  His pre-implant score was 1. He denies any abdominal pain or bowel symptoms.  He has continued working and denies any significant fatigue or change in his stamina.  Overall, he is quite pleased with his progress to date.  ALLERGIES:  is allergic to beef-derived products and pork-derived products.  Meds: Current Outpatient Medications  Medication Sig Dispense Refill  . amLODipine (NORVASC) 10 MG tablet Take 10 mg by mouth at bedtime.    Marland Kitchen aspirin 81 MG chewable tablet Chew 81 mg by mouth daily.    Marland Kitchen HYDROcodone-acetaminophen (NORCO) 5-325 MG tablet Take 1 tablet by mouth every 6 (six) hours as needed for moderate pain. 8 tablet 0  . losartan-hydrochlorothiazide (HYZAAR) 100-25 MG tablet Take 1 tablet by mouth daily.    . naproxen sodium (ALEVE) 220 MG tablet Take 220 mg by mouth daily.    . diphenhydrAMINE (BENADRYL) 50 MG tablet  Take 50 mg by mouth every 6 (six) hours as needed for itching. (Patient not taking: Reported on 03/20/2020)     Current Facility-Administered Medications  Medication Dose Route Frequency Provider Last Rate Last Admin  . degarelix (FIRMAGON) injection 240 mg  240 mg Subcutaneous Once Irine Seal, MD        Physical Findings: In general this is a well appearing Caucasian male in no acute distress. He's alert and oriented x4 and appropriate throughout the examination. Cardiopulmonary assessment is negative for acute distress and he exhibits normal effort.   Lab Findings: Lab Results  Component Value Date   WBC 9.2 02/18/2020   HGB 15.1 02/18/2020   HCT 42.9 02/18/2020   MCV 92.1 02/18/2020   PLT 283 02/18/2020    Radiographic Findings:  Patient underwent CT imaging in our clinic for post implant dosimetry. The CT will be reviewed by Dr. Tammi Klippel to confirm there is an adequate distribution of radioactive seeds throughout the prostate gland and ensure that there are no seeds in or near the rectum. We suspect the final radiation plan and dosimetry will show appropriate coverage of the prostate gland. He understands that we will call and inform him of any unexpected findings on further review of his imaging and dosimetry.  Impression/Plan:  70 y.o. gentleman with Stage T2a adenocarcinoma of the prostate with Gleason score of 4+3, and PSA of 6.6. The patient is recovering from the effects of radiation. His urinary symptoms should gradually improve over the next 4-6 months. We talked  about this today. He is encouraged by his improvement already and is otherwise pleased with his outcome. We also talked about long-term follow-up for prostate cancer following seed implant. He understands that ongoing PSA determinations and digital rectal exams will help perform surveillance to rule out disease recurrence. He has a follow up appointment scheduled with Dr. Jeffie Pollock at 1pm this afternoon. He understands what to  expect with his PSA measures. Patient was also educated today about some of the long-term effects from radiation including a small risk for rectal bleeding and possibly erectile dysfunction. We talked about some of the general management approaches to these potential complications. However, I did encourage the patient to contact our office or return at any point if he has questions or concerns related to his previous radiation and prostate cancer.    Nicholos Johns, PA-C

## 2020-03-20 NOTE — Progress Notes (Signed)
Subjective: 1. Prostate cancer [facility])      Andrew Gordon returns today in f/u from his recent seed implant on 02/21/20.  He was having back pain prior to the procedure but it resolved the day of.  He is voiding well with an IPSS of 2.  He had adjuvant ADT with Lupron 01/18/20 and an  initial Mills Koller a month prior  He had no hot flashes.  His IPSS is 2.    His prostate biopsy on 11/09/19 that demonstrated 5/6 Right and 1/6 left cores with cancer.  2 on the left with GG3 and the remainder with GG2.  The prostate volume was 46.22ml.   He has T2a N0 M0 with a right base nodule and a negative CT and Bonescan.   Andrew Gordon is a 70 yo male who is sent in consultation by Dr. Monico Blitz for an elevated PSA that was 6.5 in May and 6.6. in June with an 11% f/t ratio.  He has no other GU history.   He has had prior normal PSA's in [place] in the past.  His IPSS is 3.    ROS:  ROS  Allergies  Allergen Reactions  . Beef-Derived Products Hives  . Pork-Derived Metallurgist    Past Medical History:  Diagnosis Date  . Arthritis    hands  . Hypertension    followed by pcp  . Lyme disease 2010   per pt dx 2010 after tick bite, stated had mild symptoms that resolved other than since then gets hive when eats beef / pork  . Nocturia   . Prostate cancer [facility]) urologist--- dr Jaxden Blyden/  oncologist--- dr Tammi Klippel   dx 11-09-2019,  Stage T2a, Gleason 4+3    Past Surgical History:  Procedure Laterality Date  . CYSTOSCOPY  02/21/2020   Procedure: CYSTOSCOPY FLEXIBLE;  Surgeon: Irine Seal, MD;  Location: [facility];  Service: Urology;;  No seeds detected in bladder per Dr. Jeffie Pollock  . NO PAST SURGERIES    . PROSTATE BIOPSY    . RADIOACTIVE SEED IMPLANT N/A 02/21/2020   Procedure: RADIOACTIVE SEED IMPLANT/BRACHYTHERAPY IMPLANT;  Surgeon: Irine Seal, MD;  Location: [facility];  Service: Urology;  Laterality: N/A;  . SPACE OAR INSTILLATION N/A 02/21/2020   Procedure: SPACE OAR INSTILLATION;   Surgeon: Irine Seal, MD;  Location: [facility];  Service: Urology;  Laterality: N/A;    Social History   Socioeconomic History  . Marital status: Widowed    Spouse name: Not on file  . Number of children: 1  . Years of education: Not on file  . Highest education level: Not on file  Occupational History  . Occupation: retired  Tobacco Use  . Smoking status: Current Every Day Smoker    Packs/day: 0.25    Years: 30.00    Pack years: 7.50    Types: Cigarettes  . Smokeless tobacco: Never Used  . Tobacco comment: 12-09-20212 per pt 5 cig per day  Vaping Use  . Vaping Use: Never used  Substance and Sexual Activity  . Alcohol use: Not Currently  . Drug use: Never  . Sexual activity: Not Currently    Comment: widowed. no interested.   Other Topics Concern  . Not on file  Social History Narrative  . Not on file   Social Determinants of Health   Financial Resource Strain: Not on file  Food Insecurity: Not on file  Transportation Needs: Not on file  Physical Activity: Not on file  Stress: Not on file  Social Connections: Not on file  Intimate Partner Violence: Not on file    Family History  Problem Relation Age of Onset  . Breast cancer Mother   . Breast cancer Paternal Aunt   . Prostate cancer Neg Hx   . Pancreatic cancer Neg Hx   . Colon cancer Neg Hx     Anti-infectives: Anti-infectives (From admission, onward)   None      Current Outpatient Medications  Medication Sig Dispense Refill  . amLODipine (NORVASC) 10 MG tablet Take 10 mg by mouth at bedtime.    Marland Kitchen aspirin 81 MG chewable tablet Chew 81 mg by mouth daily.    . diphenhydrAMINE (BENADRYL) 50 MG tablet Take 50 mg by mouth every 6 (six) hours as needed for itching.    Marland Kitchen HYDROcodone-acetaminophen (NORCO) 5-325 MG tablet Take 1 tablet by mouth every 6 (six) hours as needed for moderate pain. 8 tablet 0  . losartan-hydrochlorothiazide (HYZAAR) 100-25 MG tablet Take 1 tablet by mouth daily.     . naproxen sodium (ALEVE) 220 MG tablet Take 220 mg by mouth daily.     Current Facility-Administered Medications  Medication Dose Route Frequency Provider Last Rate Last Admin  . degarelix (FIRMAGON) injection 240 mg  240 mg Subcutaneous Once Irine Seal, MD         Objective: Vital signs in last 24 hours: BP 128/79   Pulse 85   Temp 98.1 F (36.7 C)   Ht 6' 1.5" (1.867 m)   Wt 287 lb 8 oz (130.4 kg)   BMI 37.42 kg/m   Intake/Output from previous day: No intake/output data recorded. Intake/Output this shift: @IOTHISSHIFT @   Physical Exam  Lab Results:  Results for orders placed or performed in visit on 03/20/20 (from the past 24 hour(s))  Urinalysis, Routine w reflex microscopic     Status: None   Collection Time: 03/20/20  2:40 PM  Result Value Ref Range   Specific Gravity, UA 1.020 1.005 - 1.030   pH, UA 6.5 5.0 - 7.5   Color, UA Yellow Yellow   Appearance Ur Clear Clear   Leukocytes,UA Negative Negative   Protein,UA Negative Negative/Trace   Glucose, UA Negative Negative   Ketones, UA Negative Negative   RBC, UA Negative Negative   Bilirubin, UA Negative Negative   Urobilinogen, Ur 0.2 0.2 - 1.0 mg/dL   Nitrite, UA Negative Negative   Microscopic Examination Comment    Narrative   Performed at:  [facility] [address], [place], [place]  [id number] Lab Director: [place], Phone:  [phone]    BMET No results for input(s): NA, K, CL, CO2, GLUCOSE, BUN, CREATININE, CALCIUM in the last 72 hours. PT/INR No results for input(s): LABPROT, INR in the last 72 hours. ABG No results for input(s): PHART, HCO3 in the last 72 hours.  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   He is doing well with  ADT and a seed implant.  He will return in 3 mo with a PSA and testosterone.   No orders of the defined types were placed in this encounter.     Orders Placed This Encounter  Procedures  . Urinalysis, Routine w reflex microscopic  . PSA    Standing Status:   Future    Standing Expiration Date:   07/18/2020  . Testosterone    Standing Status:   Future  Standing Expiration Date:   07/18/2020     Return in about 3 months (around 06/18/2020) for with labs.    CC: Dr. Monico Blitz.    Irine Seal 03/20/2020 [phone] ID: Andrew Gordon, male   DOB: 1950/06/06, 70 y.o.   MRN: [id number]

## 2020-03-20 NOTE — Progress Notes (Signed)
Patient denies any pain . Patient denies any hematuria  or dysuria .Patient reports nocturia x1. Patient states that he empties his bladder with urination .Patient reports a moderate stream. Patient denies any leakage or urgency with urination.Patient states that he has an appointment with his urologist today at [place]:   03/20/20 0821  BP: 129/75  Pulse: 86  Resp: 18  Temp: (!) 97.5 F (36.4 C)  TempSrc: Temporal  SpO2: 100%  Weight: 131.1 kg

## 2020-03-20 NOTE — Addendum Note (Signed)
Encounter addended by: Sherrlyn Hock, RN on: 03/20/2020 9:17 AM  Actions taken: Flowsheet accepted, Charge Capture section accepted

## 2020-03-20 NOTE — Progress Notes (Signed)

## 2020-04-10 ENCOUNTER — Ambulatory Visit
Admission: RE | Admit: 2020-04-10 | Discharge: 2020-04-10 | Disposition: A | Discharge status: Home / Self Care | Payer: Medicare HMO | Source: Ambulatory Visit | Attending: Radiation Oncology | Admitting: Radiation Oncology

## 2020-04-10 ENCOUNTER — Encounter: Payer: Self-pay | Admitting: Radiation Oncology

## 2020-04-10 DIAGNOSIS — C61 Malignant neoplasm of prostate: Secondary | ICD-10-CM | POA: Diagnosis not present

## 2020-04-10 NOTE — Progress Notes (Signed)
  Radiation Oncology         [phone] ________________________________  Name: Andrew Gordon MRN: [id number]  Date: 04/10/2020  DOB: December 07, 1950  3D Planning Note   Prostate Brachytherapy Post-Implant Dosimetry  Diagnosis:  70 y.o. gentleman with Stage T2a adenocarcinoma of the prostate with Gleason score of 4+3, and PSA of 6.6.  Narrative: On a previous date, Andrew Gordon returned following prostate seed implantation for post implant planning. He underwent CT scan complex simulation to delineate the three-dimensional structures of the pelvis and demonstrate the radiation distribution.  Since that time, the seed localization, and complex isodose planning with dose volume histograms have now been completed.  Results:   Prostate Coverage - The dose of radiation delivered to the 90% or more of the prostate gland (D90) was 102.22% of the prescription dose. This exceeds our goal of greater than 90%. Rectal Sparing - The volume of rectal tissue receiving the prescription dose or higher was 0.0 cc. This falls under our thresholds tolerance of 1.0 cc.  Impression: The prostate seed implant appears to show adequate target coverage and appropriate rectal sparing.  Plan:  The patient will continue to follow with urology for ongoing PSA determinations. I would anticipate a high likelihood for local tumor control with minimal risk for rectal morbidity.  ________________________________  Sheral Apley Tammi Klippel, M.D.

## 2020-05-16 DIAGNOSIS — H109 Unspecified conjunctivitis: Secondary | ICD-10-CM | POA: Diagnosis not present

## 2020-05-16 DIAGNOSIS — Z299 Encounter for prophylactic measures, unspecified: Secondary | ICD-10-CM | POA: Diagnosis not present

## 2020-05-16 DIAGNOSIS — I10 Essential (primary) hypertension: Secondary | ICD-10-CM | POA: Diagnosis not present

## 2020-05-16 DIAGNOSIS — Z713 Dietary counseling and surveillance: Secondary | ICD-10-CM | POA: Diagnosis not present

## 2020-05-16 DIAGNOSIS — Z6838 Body mass index (BMI) 38.0-38.9, adult: Secondary | ICD-10-CM | POA: Diagnosis not present

## 2020-05-19 DIAGNOSIS — C61 Malignant neoplasm of prostate: Secondary | ICD-10-CM | POA: Diagnosis not present

## 2020-05-19 DIAGNOSIS — R69 Illness, unspecified: Secondary | ICD-10-CM | POA: Diagnosis not present

## 2020-05-19 DIAGNOSIS — I10 Essential (primary) hypertension: Secondary | ICD-10-CM | POA: Diagnosis not present

## 2020-05-19 DIAGNOSIS — F17210 Nicotine dependence, cigarettes, uncomplicated: Secondary | ICD-10-CM | POA: Diagnosis not present

## 2020-05-19 DIAGNOSIS — H109 Unspecified conjunctivitis: Secondary | ICD-10-CM | POA: Diagnosis not present

## 2020-05-19 DIAGNOSIS — Z6838 Body mass index (BMI) 38.0-38.9, adult: Secondary | ICD-10-CM | POA: Diagnosis not present

## 2020-05-19 DIAGNOSIS — Z299 Encounter for prophylactic measures, unspecified: Secondary | ICD-10-CM | POA: Diagnosis not present

## 2020-06-12 ENCOUNTER — Other Ambulatory Visit: Payer: Self-pay

## 2020-06-12 ENCOUNTER — Other Ambulatory Visit: Payer: Medicare HMO

## 2020-06-12 DIAGNOSIS — C61 Malignant neoplasm of prostate: Secondary | ICD-10-CM | POA: Diagnosis not present

## 2020-06-13 LAB — TESTOSTERONE: Testosterone: <3 ng/dL — ABNORMAL LOW (ref 264–916)

## 2020-06-13 LAB — PSA: Prostate Specific Ag, Serum: <0.1 ng/mL (ref 0.0–4.0)

## 2020-06-19 ENCOUNTER — Other Ambulatory Visit: Payer: Self-pay

## 2020-06-19 ENCOUNTER — Ambulatory Visit (INDEPENDENT_AMBULATORY_CARE_PROVIDER_SITE_OTHER): Payer: Medicare HMO | Admitting: Urology

## 2020-06-19 ENCOUNTER — Encounter: Payer: Self-pay | Admitting: Urology

## 2020-06-19 VITALS — BP 115/68 | HR 87 | Temp 97.9°F | Ht 73.5 in | Wt 285.0 lb

## 2020-06-19 DIAGNOSIS — C61 Malignant neoplasm of prostate: Secondary | ICD-10-CM

## 2020-06-19 DIAGNOSIS — R7989 Other specified abnormal findings of blood chemistry: Secondary | ICD-10-CM | POA: Diagnosis not present

## 2020-06-19 NOTE — Progress Notes (Signed)
Subjective: 1. 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    He is voiding well with an IPSS of 2.  He had adjuvant ADT with Lupron 01/18/20 and an  initial Mills Koller a month prior  He had no hot flashes.  His testosterone level remains low at <3 and his PSA is <0.1  His prostate biopsy on 11/09/19 that demonstrated 5/6 Right and 1/6 left cores with cancer.  2 on the left with GG3 and the remainder with GG2.  The prostate volume was 46.75ml.   He has T2a N0 M0 with a right base nodule and a negative CT and Bonescan.   Andrew Gordon is a 70 yo male who is sent in consultation by Dr. Monico Blitz for an elevated PSA that was 6.5 in May and 6.6. in June with an 11% f/t ratio.  He has no other GU history.   He has had prior normal PSA's in [place] in the past.  His IPSS is 3.    ROS:  ROS  Allergies  Allergen Reactions  . Beef-Derived Products Hives  . Pork-Derived Metallurgist    Past Medical History:  Diagnosis Date  . Arthritis    hands  . Hypertension    followed by pcp  . Lyme disease 2010   per pt dx 2010 after tick bite, stated had mild symptoms that resolved other than since then gets hive when eats beef / pork  . Nocturia   . Prostate cancer [facility]) urologist--- dr Manjot Hinks/  oncologist--- dr Tammi Klippel   dx 11-09-2019,  Stage T2a, Gleason 4+3    Past Surgical History:  Procedure Laterality Date  . CYSTOSCOPY  02/21/2020   Procedure: CYSTOSCOPY FLEXIBLE;  Surgeon: Irine Seal, MD;  Location: [facility];  Service: Urology;;  No seeds detected in bladder per Dr. Jeffie Pollock  . NO PAST SURGERIES    . PROSTATE BIOPSY    . RADIOACTIVE SEED IMPLANT N/A 02/21/2020   Procedure: RADIOACTIVE SEED IMPLANT/BRACHYTHERAPY IMPLANT;  Surgeon: Irine Seal, MD;  Location: [facility];  Service: Urology;  Laterality: N/A;  . SPACE OAR INSTILLATION N/A 02/21/2020   Procedure: SPACE OAR INSTILLATION;   Surgeon: Irine Seal, MD;  [facility];  Service: Urology;  Laterality: N/A;    Social History   Socioeconomic History  . Marital status: Widowed    Spouse name: Not on file  . Number of children: 1  . Years of education: Not on file  . Highest education level: Not on file  Occupational History  . Occupation: retired  Tobacco Use  . Smoking status: Current Every Day Smoker    Packs/day: 0.25    Years: 30.00    Pack years: 7.50    Types: Cigarettes  . Smokeless tobacco: Never Used  . Tobacco comment: 12-09-20212 per pt 5 cig per day  Vaping Use  . Vaping Use: Never used  Substance and Sexual Activity  . Alcohol use: Not Currently  . Drug use: Never  . Sexual activity: Not Currently    Comment: widowed. no interested.   Other Topics Concern  . Not on file  Social History Narrative  . Not on file   Social Determinants of Health   Financial Resource Strain: Not on file  Food Insecurity: Not on file  Transportation Needs: Not on file  Physical Activity: Not on file  Stress: Not on file  Social Connections: Not on file  Intimate Partner Violence: Not on file    Family History  Problem Relation Age of Onset  . Breast cancer Mother   . Breast cancer Paternal Aunt   . Prostate cancer Neg Hx   . Pancreatic cancer Neg Hx   . Colon cancer Neg Hx     Anti-infectives: Anti-infectives (From admission, onward)   None      Current Outpatient Medications  Medication Sig Dispense Refill  . amLODipine (NORVASC) 10 MG tablet Take 10 mg by mouth at bedtime.    Marland Kitchen aspirin 81 MG chewable tablet Chew 81 mg by mouth daily.    . diphenhydrAMINE (BENADRYL) 50 MG tablet Take 50 mg by mouth every 6 (six) hours as needed for itching.    Marland Kitchen HYDROcodone-acetaminophen (NORCO) 5-325 MG tablet Take 1 tablet by mouth every 6 (six) hours as needed for moderate pain. 8 tablet 0  . losartan-hydrochlorothiazide (HYZAAR) 100-25 MG tablet Take 1 tablet by mouth daily.     . naproxen sodium (ALEVE) 220 MG tablet Take 220 mg by mouth daily.     Current Facility-Administered Medications  Medication Dose Route Frequency Provider Last Rate Last Admin  . degarelix (FIRMAGON) injection 240 mg  240 mg Subcutaneous Once Irine Seal, MD         Objective: Vital signs in last 24 hours: BP 115/68   Pulse 87   Temp 97.9 F (36.6 C)   Ht 6' 1.5" (1.867 m)   Wt 285 lb (129.3 kg)   BMI 37.09 kg/m   Intake/Output from previous day: No intake/output data recorded. Intake/Output this shift: @IOTHISSHIFT @   Physical Exam  Lab Results:  Recent Results (from the past 2160 hour(s))  Testosterone     Status: Abnormal   Collection Time: 06/12/20 10:49 AM  Result Value Ref Range   Testosterone <3 (L) 264 - 916 ng/dL    Comment: Adult male reference interval is based on a population of healthy nonobese males (BMI <30) between 97 and 65 years old. Andrew Gordon, [place] [phone]. PMID: [id number].   PSA     Status: None   Collection Time: 06/12/20 10:49 AM  Result Value Ref Range   Prostate Specific Ag, Serum <0.1 0.0 - 4.0 ng/mL    Comment: **Verified by repeat analysis** Roche ECLIA methodology. According to the American Urological Association, Serum PSA should decrease and remain at undetectable levels after radical prostatectomy. The AUA defines biochemical recurrence as an initial PSA value 0.2 ng/mL or greater followed by a subsequent confirmatory PSA value 0.2 ng/mL or greater. Values obtained with different assay methods or kits cannot be used interchangeably. Results cannot be interpreted as absolute evidence of the presence or absence of malignant disease.     Studies/Results: No results found for this or any previous visit (from the past 24 hour(s)).   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   He is doing well with  ADT and a seed implant.  His testosterone remains <3 and his PSA is <0.1.   He  will return in 6 mo with a PSA and testosterone.   No orders of the defined types were placed in this encounter.    Orders Placed This Encounter  Procedures  . Urinalysis, Routine w reflex microscopic  . PSA    Standing Status:   Future    Standing Expiration Date:   06/19/2021  . Testosterone    Standing Status:  Future    Standing Expiration Date:   06/19/2021     Return in about 6 months (around 12/19/2020) for PSA and testosterone prior to f/u. Marland Kitchen    CC: Dr. Monico Blitz.    Irine Seal 06/19/2020 [phone] ID: Andrew Gordon, male   DOB: June 09, 1950, 70 y.o.   MRN: [id number] Patient ID: Andrew Gordon, male   DOB: 1950/12/02, 70 y.o.   MRN: [id number]

## 2020-06-19 NOTE — Progress Notes (Signed)

## 2020-07-30 DIAGNOSIS — F17210 Nicotine dependence, cigarettes, uncomplicated: Secondary | ICD-10-CM | POA: Diagnosis not present

## 2020-07-30 DIAGNOSIS — Z7189 Other specified counseling: Secondary | ICD-10-CM | POA: Diagnosis not present

## 2020-07-30 DIAGNOSIS — Z1339 Encounter for screening examination for other mental health and behavioral disorders: Secondary | ICD-10-CM | POA: Diagnosis not present

## 2020-07-30 DIAGNOSIS — Z0000 Encounter for general adult medical examination without abnormal findings: Secondary | ICD-10-CM | POA: Diagnosis not present

## 2020-07-30 DIAGNOSIS — Z1331 Encounter for screening for depression: Secondary | ICD-10-CM | POA: Diagnosis not present

## 2020-07-30 DIAGNOSIS — Z299 Encounter for prophylactic measures, unspecified: Secondary | ICD-10-CM | POA: Diagnosis not present

## 2020-07-30 DIAGNOSIS — I10 Essential (primary) hypertension: Secondary | ICD-10-CM | POA: Diagnosis not present

## 2020-07-30 DIAGNOSIS — R69 Illness, unspecified: Secondary | ICD-10-CM | POA: Diagnosis not present

## 2020-07-30 DIAGNOSIS — Z6837 Body mass index (BMI) 37.0-37.9, adult: Secondary | ICD-10-CM | POA: Diagnosis not present

## 2020-07-30 DIAGNOSIS — Z79899 Other long term (current) drug therapy: Secondary | ICD-10-CM | POA: Diagnosis not present

## 2020-07-30 DIAGNOSIS — C61 Malignant neoplasm of prostate: Secondary | ICD-10-CM | POA: Diagnosis not present

## 2020-08-11 DIAGNOSIS — Z299 Encounter for prophylactic measures, unspecified: Secondary | ICD-10-CM | POA: Diagnosis not present

## 2020-08-11 DIAGNOSIS — J309 Allergic rhinitis, unspecified: Secondary | ICD-10-CM | POA: Diagnosis not present

## 2020-08-11 DIAGNOSIS — R69 Illness, unspecified: Secondary | ICD-10-CM | POA: Diagnosis not present

## 2020-08-11 DIAGNOSIS — I10 Essential (primary) hypertension: Secondary | ICD-10-CM | POA: Diagnosis not present

## 2020-08-11 DIAGNOSIS — F17210 Nicotine dependence, cigarettes, uncomplicated: Secondary | ICD-10-CM | POA: Diagnosis not present

## 2020-08-11 DIAGNOSIS — H1010 Acute atopic conjunctivitis, unspecified eye: Secondary | ICD-10-CM | POA: Diagnosis not present

## 2020-12-25 ENCOUNTER — Other Ambulatory Visit: Payer: Medicare HMO

## 2021-01-01 ENCOUNTER — Ambulatory Visit: Payer: Medicare HMO | Admitting: Urology

## 2021-02-03 DIAGNOSIS — Z713 Dietary counseling and surveillance: Secondary | ICD-10-CM | POA: Diagnosis not present

## 2021-02-03 DIAGNOSIS — I10 Essential (primary) hypertension: Secondary | ICD-10-CM | POA: Diagnosis not present

## 2021-02-03 DIAGNOSIS — Z299 Encounter for prophylactic measures, unspecified: Secondary | ICD-10-CM | POA: Diagnosis not present

## 2021-02-03 DIAGNOSIS — Z6837 Body mass index (BMI) 37.0-37.9, adult: Secondary | ICD-10-CM | POA: Diagnosis not present

## 2021-02-19 ENCOUNTER — Other Ambulatory Visit: Payer: Self-pay

## 2021-02-19 ENCOUNTER — Other Ambulatory Visit: Payer: Medicare HMO

## 2021-02-19 DIAGNOSIS — C61 Malignant neoplasm of prostate: Secondary | ICD-10-CM

## 2021-02-20 LAB — TESTOSTERONE: Testosterone: 524 ng/dL (ref 264–916)

## 2021-02-20 LAB — PSA: Prostate Specific Ag, Serum: <0.1 ng/mL (ref 0.0–4.0)

## 2021-02-26 ENCOUNTER — Encounter: Payer: Self-pay | Admitting: Urology

## 2021-02-26 ENCOUNTER — Ambulatory Visit (INDEPENDENT_AMBULATORY_CARE_PROVIDER_SITE_OTHER): Payer: Medicare HMO | Admitting: Urology

## 2021-02-26 ENCOUNTER — Other Ambulatory Visit: Payer: Self-pay

## 2021-02-26 VITALS — BP 144/83 | HR 86

## 2021-02-26 DIAGNOSIS — Z8546 Personal history of malignant neoplasm of prostate: Secondary | ICD-10-CM

## 2021-02-26 LAB — URINALYSIS, ROUTINE W REFLEX MICROSCOPIC
Bilirubin, UA: NEGATIVE
Glucose, UA: NEGATIVE
Ketones, UA: NEGATIVE
Leukocytes,UA: NEGATIVE
Nitrite, UA: NEGATIVE
Protein,UA: NEGATIVE
Specific Gravity, UA: 1.015 (ref 1.005–1.030)
Urobilinogen, Ur: 0.2 mg/dL (ref 0.2–1.0)
pH, UA: 5.5 (ref 5.0–7.5)

## 2021-02-26 LAB — MICROSCOPIC EXAMINATION
Bacteria, UA: NONE SEEN
Epithelial Cells (non renal): NONE SEEN /hpf (ref 0–10)
RBC, Urine: NONE SEEN /hpf (ref 0–2)
Renal Epithel, UA: NONE SEEN /hpf
WBC, UA: NONE SEEN /hpf (ref 0–5)

## 2021-02-26 NOTE — Progress Notes (Signed)

## 2021-02-26 NOTE — Progress Notes (Signed)
Subjective: 1. History of prostate cancer     02/26/21: [name] returns today in f/u for the history below.   His PSA is <0.1.  The testosterone is back to to normal at 524.  He has rare nocturia.  He has a good stream.  He has no hematuria.  He has no GI complaints.  He has no weight loss.  He has no bone pain.     06/19/20: [name] returns today in f/u from his recent seed implant on 02/21/20.    He is voiding well with an IPSS of 2.  He had adjuvant ADT with Lupron 01/18/20 and an  initial Mills Koller a month prior  He had no hot flashes.  His testosterone level remains low at <3 and his PSA is <0.1  His prostate biopsy on 11/09/19 that demonstrated 5/6 Right and 1/6 left cores with cancer.  2 on the left with GG3 and the remainder with GG2.  The prostate volume was 46.73ml.   He has T2a N0 M0 with a right base nodule and a negative CT and Bonescan.   [name] is a 70 yo male who is sent in consultation by Dr. Monico Blitz for an elevated PSA that was 6.5 in May and 6.6. in June with an 11% f/t ratio.  He has no other GU history.   He has had prior normal PSA's in [place] in the past.  His IPSS is 3.    ROS:  ROS  Allergies  Allergen Reactions   Beef-Derived Products Hives   Pork-Derived Products Hives    Past Medical History:  Diagnosis Date   Arthritis    hands   Hypertension    followed by pcp   Lyme disease 2010   per pt dx 2010 after tick bite, stated had mild symptoms that resolved other than since then gets hive when eats beef / pork   Nocturia    Prostate cancer [facility]) urologist--- dr Baley Shands/  oncologist--- dr Tammi Klippel   dx 11-09-2019,  Stage T2a, Gleason 4+3    Past Surgical History:  Procedure Laterality Date   CYSTOSCOPY  02/21/2020   Procedure: CYSTOSCOPY FLEXIBLE;  Surgeon: Irine Seal, MD;  Location: [facility];  Service: Urology;;  No seeds detected in bladder per Dr. Jeffie Pollock   NO PAST SURGERIES     PROSTATE BIOPSY     RADIOACTIVE SEED IMPLANT N/A 02/21/2020    Procedure: RADIOACTIVE SEED IMPLANT/BRACHYTHERAPY IMPLANT;  Surgeon: Irine Seal, MD;  Location: [facility];  Service: Urology;  Laterality: N/A;   SPACE OAR INSTILLATION N/A 02/21/2020   Procedure: SPACE OAR INSTILLATION;  Surgeon: Irine Seal, MD;  Location: [facility];  Service: Urology;  Laterality: N/A;    Social History   Socioeconomic History   Marital status: Widowed    Spouse name: Not on file   Number of children: 1   Years of education: Not on file   Highest education level: Not on file  Occupational History   Occupation: retired  Tobacco Use   Smoking status: Every Day    Packs/day: 0.25    Years: 30.00    Pack years: 7.50    Types: Cigarettes   Smokeless tobacco: Never   Tobacco comments:    12-09-20212 per pt 5 cig per day  Vaping Use   Vaping Use: Never used  Substance and Sexual Activity   Alcohol use: Not Currently   Drug use: Never   Sexual activity: Not Currently  Comment: widowed. no interested.   Other Topics Concern   Not on file  Social History Narrative   Not on file   Social Determinants of Health   Financial Resource Strain: Not on file  Food Insecurity: Not on file  Transportation Needs: Not on file  Physical Activity: Not on file  Stress: Not on file  Social Connections: Not on file  Intimate Partner Violence: Not on file    Family History  Problem Relation Age of Onset   Breast cancer Mother    Breast cancer Paternal Aunt    Prostate cancer Neg Hx    Pancreatic cancer Neg Hx    Colon cancer Neg Hx     Anti-infectives: Anti-infectives (From admission, onward)    None       Current Outpatient Medications  Medication Sig Dispense Refill   amLODipine (NORVASC) 10 MG tablet Take 10 mg by mouth at bedtime.     aspirin 81 MG chewable tablet Chew 81 mg by mouth daily.     diphenhydrAMINE (BENADRYL) 50 MG tablet Take 50 mg by mouth every 6 (six) hours as needed for itching.      HYDROcodone-acetaminophen (NORCO) 5-325 MG tablet Take 1 tablet by mouth every 6 (six) hours as needed for moderate pain. 8 tablet 0   losartan-hydrochlorothiazide (HYZAAR) 100-25 MG tablet Take 1 tablet by mouth daily.     naproxen sodium (ALEVE) 220 MG tablet Take 220 mg by mouth daily.     Current Facility-Administered Medications  Medication Dose Route Frequency Provider Last Rate Last Admin   degarelix (FIRMAGON) injection 240 mg  240 mg Subcutaneous Once Irine Seal, MD         Objective: Vital signs in last 24 hours: BP (!) 144/83    Pulse 86   Intake/Output from previous day: No intake/output data recorded. Intake/Output this shift: @IOTHISSHIFT @   Physical Exam  Lab Results:  Recent Results (from the past 2160 hour(s))  Testosterone     Status: None   Collection Time: 02/19/21  8:37 AM  Result Value Ref Range   Testosterone 524 264 - 916 ng/dL    Comment: Adult male reference interval is based on a population of healthy nonobese males (BMI <30) between 62 and 26 years old. [place], [place] [phone]. PMID: [id number].   PSA     Status: None   Collection Time: 02/19/21  8:37 AM  Result Value Ref Range   Prostate Specific Ag, Serum <0.1 0.0 - 4.0 ng/mL    Comment: Roche ECLIA methodology. According to the American Urological Association, Serum PSA should decrease and remain at undetectable levels after radical prostatectomy. The AUA defines biochemical recurrence as an initial PSA value 0.2 ng/mL or greater followed by a subsequent confirmatory PSA value 0.2 ng/mL or greater. Values obtained with different assay methods or kits cannot be used interchangeably. Results cannot be interpreted as absolute evidence of the presence or absence of malignant disease.   Urinalysis, Routine w reflex microscopic     Status: Abnormal   Collection Time: 02/26/21  1:44 PM  Result Value Ref Range   Specific Gravity, UA 1.015 1.005 - 1.030   pH, UA 5.5 5.0 - 7.5    Color, UA Yellow Yellow   Appearance Ur Clear Clear   Leukocytes,UA Negative Negative   Protein,UA Negative Negative/Trace   Glucose, UA Negative Negative   Ketones, UA Negative Negative   RBC, UA Trace (A) Negative   Bilirubin, UA Negative Negative   Urobilinogen, Ur  0.2 0.2 - 1.0 mg/dL   Nitrite, UA Negative Negative   Microscopic Examination See below:   Microscopic Examination     Status: None   Collection Time: 02/26/21  1:44 PM   Urine  Result Value Ref Range   WBC, UA None seen 0 - 5 /hpf   RBC None seen 0 - 2 /hpf   Epithelial Cells (non renal) None seen 0 - 10 /hpf   Renal Epithel, UA None seen None seen /hpf   Bacteria, UA None seen None seen/Few    Studies/Results: Results for orders placed or performed in visit on 02/26/21 (from the past 24 hour(s))  Urinalysis, Routine w reflex microscopic     Status: Abnormal   Collection Time: 02/26/21  1:44 PM  Result Value Ref Range   Specific Gravity, UA 1.015 1.005 - 1.030   pH, UA 5.5 5.0 - 7.5   Color, UA Yellow Yellow   Appearance Ur Clear Clear   Leukocytes,UA Negative Negative   Protein,UA Negative Negative/Trace   Glucose, UA Negative Negative   Ketones, UA Negative Negative   RBC, UA Trace (A) Negative   Bilirubin, UA Negative Negative   Urobilinogen, Ur 0.2 0.2 - 1.0 mg/dL   Nitrite, UA Negative Negative   Microscopic Examination See below:    Narrative   Performed at:  [facility] [address], [place], [place]  [id number] Lab Director: Mina Marble [place], Phone:  [phone]  Microscopic Examination     Status: None   Collection Time: 02/26/21  1:44 PM   Urine  Result Value Ref Range   WBC, UA None seen 0 - 5 /hpf   RBC None seen 0 - 2 /hpf   Epithelial Cells (non renal) None seen 0 - 10 /hpf   Renal Epithel, UA None seen None seen /hpf   Bacteria, UA None seen None seen/Few   Narrative   Performed at:  [facility] [address] 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   He is doing well with  ADT and a seed implant.  His testosterone has normalized but his PSA remains undetectible at <0.1.   He will return in 6 mo with a PSA.   No orders of the defined types were placed in this encounter.    Orders Placed This Encounter  Procedures   Microscopic Examination   Urinalysis, Routine w reflex microscopic   PSA    Standing Status:   Future    Standing Expiration Date:   02/26/2022     Return in about 6 months (around 08/27/2021) for with PSA.Marland Kitchen    CC: Dr. Monico Blitz.    Irine Seal 02/26/2021 [phone] ID: Andrew Gordon, male   DOB: 20-Dec-1950, 70 y.o.   MRN: [id number] Patient ID: Andrew Gordon, male   DOB: 12/20/1950, 70 y.o.   MRN: [id number]

## 2021-03-12 ENCOUNTER — Ambulatory Visit: Payer: Medicare HMO | Admitting: Urology

## 2021-05-11 DIAGNOSIS — I10 Essential (primary) hypertension: Secondary | ICD-10-CM | POA: Diagnosis not present

## 2021-05-11 DIAGNOSIS — Z299 Encounter for prophylactic measures, unspecified: Secondary | ICD-10-CM | POA: Diagnosis not present

## 2021-05-11 DIAGNOSIS — F17210 Nicotine dependence, cigarettes, uncomplicated: Secondary | ICD-10-CM | POA: Diagnosis not present

## 2021-05-11 DIAGNOSIS — R69 Illness, unspecified: Secondary | ICD-10-CM | POA: Diagnosis not present

## 2021-08-07 DIAGNOSIS — Z299 Encounter for prophylactic measures, unspecified: Secondary | ICD-10-CM | POA: Diagnosis not present

## 2021-08-07 DIAGNOSIS — R69 Illness, unspecified: Secondary | ICD-10-CM | POA: Diagnosis not present

## 2021-08-07 DIAGNOSIS — C61 Malignant neoplasm of prostate: Secondary | ICD-10-CM | POA: Diagnosis not present

## 2021-08-07 DIAGNOSIS — I10 Essential (primary) hypertension: Secondary | ICD-10-CM | POA: Diagnosis not present

## 2021-08-07 DIAGNOSIS — Z1331 Encounter for screening for depression: Secondary | ICD-10-CM | POA: Diagnosis not present

## 2021-08-07 DIAGNOSIS — E7800 Pure hypercholesterolemia, unspecified: Secondary | ICD-10-CM | POA: Diagnosis not present

## 2021-08-07 DIAGNOSIS — Z0000 Encounter for general adult medical examination without abnormal findings: Secondary | ICD-10-CM | POA: Diagnosis not present

## 2021-08-07 DIAGNOSIS — Z6837 Body mass index (BMI) 37.0-37.9, adult: Secondary | ICD-10-CM | POA: Diagnosis not present

## 2021-08-07 DIAGNOSIS — F17210 Nicotine dependence, cigarettes, uncomplicated: Secondary | ICD-10-CM | POA: Diagnosis not present

## 2021-08-07 DIAGNOSIS — Z79899 Other long term (current) drug therapy: Secondary | ICD-10-CM | POA: Diagnosis not present

## 2021-08-07 DIAGNOSIS — Z7189 Other specified counseling: Secondary | ICD-10-CM | POA: Diagnosis not present

## 2021-08-20 ENCOUNTER — Other Ambulatory Visit: Payer: Medicare HMO

## 2021-08-20 ENCOUNTER — Other Ambulatory Visit: Payer: Self-pay

## 2021-08-20 DIAGNOSIS — C61 Malignant neoplasm of prostate: Secondary | ICD-10-CM

## 2021-08-20 DIAGNOSIS — Z8546 Personal history of malignant neoplasm of prostate: Secondary | ICD-10-CM

## 2021-08-20 DIAGNOSIS — R972 Elevated prostate specific antigen [PSA]: Secondary | ICD-10-CM

## 2021-08-21 LAB — PSA: Prostate Specific Ag, Serum: <0.1 ng/mL (ref 0.0–4.0)

## 2021-08-27 ENCOUNTER — Ambulatory Visit: Payer: Medicare HMO | Admitting: Urology

## 2021-08-27 ENCOUNTER — Encounter: Payer: Self-pay | Admitting: Urology

## 2021-08-27 VITALS — BP 99/65 | HR 84

## 2021-08-27 DIAGNOSIS — C61 Malignant neoplasm of prostate: Secondary | ICD-10-CM | POA: Diagnosis not present

## 2021-08-27 DIAGNOSIS — Z8546 Personal history of malignant neoplasm of prostate: Secondary | ICD-10-CM

## 2021-08-27 LAB — URINALYSIS, ROUTINE W REFLEX MICROSCOPIC
Bilirubin, UA: NEGATIVE
Glucose, UA: NEGATIVE
Ketones, UA: NEGATIVE
Leukocytes,UA: NEGATIVE
Nitrite, UA: NEGATIVE
Protein,UA: NEGATIVE
RBC, UA: NEGATIVE
Specific Gravity, UA: <1.005 — ABNORMAL LOW (ref 1.005–1.030)
Urobilinogen, Ur: 0.2 mg/dL (ref 0.2–1.0)
pH, UA: 5.5 (ref 5.0–7.5)

## 2021-08-27 NOTE — Progress Notes (Unsigned)
Subjective: 1. History of prostate cancer      08/27/21: Lauro returns in f/u for his history of prostate cancer treatred with ST- ADT and a seed implant that he had in 12/21.  His PSA remains <0.1.  He has had no hematuria. He is voiding well with an IPSS of 2.  He has no GI complaints.  He has no bone pain or unintentional weight loss.  UA is clear.   02/26/21: Mr. Allston returns today in f/u for the history below.   His PSA is <0.1.  The testosterone is back to to normal at 524.  He has rare nocturia.  He has a good stream.  He has no hematuria.  He has no GI complaints.  He has no weight loss.  He has no bone pain.     06/19/20: Mr. Bostic returns today in f/u from his recent seed implant on 02/21/20.    He is voiding well with an IPSS of 2.  He had adjuvant ADT with Lupron 01/18/20 and an  initial Mills Koller a month prior  He had no hot flashes.  His testosterone level remains low at <3 and his PSA is <0.1  His prostate biopsy on 11/09/19 that demonstrated 5/6 Right and 1/6 left cores with cancer.  2 on the left with GG3 and the remainder with GG2.  The prostate volume was 46.18m.   He has T2a N0 M0 with a right base nodule and a negative CT and Bonescan.   Mr. CMichaelis a 71yo male who is sent in consultation by Dr. AMonico Blitzfor an elevated PSA that was 6.5 in May and 6.6. in June with an 11% f/t ratio.  He has no other GU history.   [place]  His IPSS is 3.      IPSS     Row Name 08/27/21 1000         International Prostate Symptom Score   How often have you had the sensation of not emptying your bladder? Not at All     How often have you had to urinate less than every two hours? Not at All     How often have you found you stopped and started again several times when you urinated? Not at All     How often have you found it difficult to postpone urination? Less than 1 in 5 times     How often have you had a weak urinary stream? Not at All     How  often have you had to strain to start urination? Not at All     How many times did you typically get up at night to urinate? 1 Time     Total IPSS Score 2       Quality of Life due to urinary symptoms   If you were to spend the rest of your life with your urinary condition just the way it is now how would you feel about that? Delighted              ROS:  Review of Systems  All other systems reviewed and are negative.   Allergies  Allergen Reactions   Beef-Derived Products Hives   Pork-Derived Products Hives    Past Medical History:  Diagnosis Date   Arthritis    hands   Hypertension    followed by pcp   Lyme disease 2010   per pt dx 2010  after tick bite, stated had mild symptoms that resolved other than since then gets hive when eats beef / pork   Nocturia    Prostate cancer [facility]) urologist--- dr Areanna Gengler/  oncologist--- dr Tammi Klippel   dx 11-09-2019,  Stage T2a, Gleason 4+3    Past Surgical History:  Procedure Laterality Date   CYSTOSCOPY  02/21/2020   Procedure: CYSTOSCOPY FLEXIBLE;  Surgeon: Irine Seal, MD;  Location: [facility];  Service: Urology;;  No seeds detected in bladder per Dr. Jeffie Pollock   NO PAST SURGERIES     PROSTATE BIOPSY     RADIOACTIVE SEED IMPLANT N/A 02/21/2020   Procedure: RADIOACTIVE SEED IMPLANT/BRACHYTHERAPY IMPLANT;  Surgeon: Irine Seal, MD;  Location: [facility];  Service: Urology;  Laterality: N/A;   SPACE OAR INSTILLATION N/A 02/21/2020   Procedure: SPACE OAR INSTILLATION;  Surgeon: Irine Seal, MD;  Location: [facility];  Service: Urology;  Laterality: N/A;    Social History   Socioeconomic History   Marital status: Widowed    Spouse name: Not on file   Number of children: 1   Years of education: Not on file   Highest education level: Not on file  Occupational History   Occupation: retired  Tobacco Use   Smoking status: Every Day    Packs/day: 0.25    Years: 30.00    Total pack years:  7.50    Types: Cigarettes   Smokeless tobacco: Never   Tobacco comments:    12-09-20212 per pt 5 cig per day  Vaping Use   Vaping Use: Never used  Substance and Sexual Activity   Alcohol use: Not Currently   Drug use: Never   Sexual activity: Not Currently    Comment: widowed. no interested.   Other Topics Concern   Not on file  Social History Narrative   Not on file   Social Determinants of Health   Financial Resource Strain: Not on file  Food Insecurity: Not on file  Transportation Needs: Not on file  Physical Activity: Not on file  Stress: Not on file  Social Connections: Not on file  Intimate Partner Violence: Not on file    Family History  Problem Relation Age of Onset   Breast cancer Mother    Breast cancer Paternal Aunt    Prostate cancer Neg Hx    Pancreatic cancer Neg Hx    Colon cancer Neg Hx     Anti-infectives: Anti-infectives (From admission, onward)    None       Current Outpatient Medications  Medication Sig Dispense Refill   amLODipine (NORVASC) 10 MG tablet Take 10 mg by mouth at bedtime.     aspirin 81 MG chewable tablet Chew 81 mg by mouth daily.     diphenhydrAMINE (BENADRYL) 50 MG tablet Take 50 mg by mouth every 6 (six) hours as needed for itching.     HYDROcodone-acetaminophen (NORCO) 5-325 MG tablet Take 1 tablet by mouth every 6 (six) hours as needed for moderate pain. 8 tablet 0   losartan-hydrochlorothiazide (HYZAAR) 100-25 MG tablet Take 1 tablet by mouth daily.     naproxen sodium (ALEVE) 220 MG tablet Take 220 mg by mouth daily.     Current Facility-Administered Medications  Medication Dose Route Frequency Provider Last Rate Last Admin   degarelix (FIRMAGON) injection 240 mg  240 mg Subcutaneous Once Irine Seal, MD         Objective: Vital signs in last 24 hours: BP 99/65   Pulse  84   Intake/Output from previous day: No intake/output data recorded. Intake/Output this shift: '@IOTHISSHIFT'$ @   Physical Exam Vitals  reviewed.  Constitutional:      Appearance: Normal appearance.  Neurological:     Mental Status: He is alert.     Lab Results:  Recent Results (from the past 2160 hour(s))  PSA     Status: None   Collection Time: 08/20/21  9:17 AM  Result Value Ref Range   Prostate Specific Ag, Serum <0.1 0.0 - 4.0 ng/mL    Comment: Roche ECLIA methodology. According to the American Urological Association, Serum PSA should decrease and remain at undetectable levels after radical prostatectomy. The AUA defines biochemical recurrence as an initial PSA value 0.2 ng/mL or greater followed by a subsequent confirmatory PSA value 0.2 ng/mL or greater. Values obtained with different assay methods or kits cannot be used interchangeably. Results cannot be interpreted as absolute evidence of the presence or absence of malignant disease.   UA is clear  Studies/Results: No results found for this or any previous visit (from the past 24 hour(s)).    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   He is doing well with  ADT and a seed implant.  His  PSA remains undetectible at <0.1.   He will return in 6 mo with a PSA.   No orders of the defined types were placed in this encounter.    Orders Placed This Encounter  Procedures   Urinalysis, Routine w reflex microscopic   PSA    Standing Status:   Future    Standing Expiration Date:   08/28/2022     Return in about 6 months (around 02/26/2022) for with PSA.    CC: Dr. Monico Blitz.    Irine Seal 08/27/2021 [phone] ID: Metro Kung, male   DOB: 1950/04/28, 71 y.o.   MRN: [id number] Patient ID: Ysidro Ramsay, male   DOB: 06-May-1950, 71 y.o.   MRN: [id number]

## 2022-02-11 ENCOUNTER — Other Ambulatory Visit: Payer: Medicare HMO

## 2022-02-11 DIAGNOSIS — Z8546 Personal history of malignant neoplasm of prostate: Secondary | ICD-10-CM

## 2022-02-13 LAB — PSA: Prostate Specific Ag, Serum: <0.1 ng/mL (ref 0.0–4.0)

## 2022-02-18 ENCOUNTER — Ambulatory Visit: Payer: Medicare HMO | Admitting: Urology

## 2022-02-18 ENCOUNTER — Encounter: Payer: Self-pay | Admitting: Urology

## 2022-02-18 VITALS — BP 131/76 | HR 74

## 2022-02-18 DIAGNOSIS — Z8546 Personal history of malignant neoplasm of prostate: Secondary | ICD-10-CM

## 2022-02-18 LAB — URINALYSIS, ROUTINE W REFLEX MICROSCOPIC
Bilirubin, UA: NEGATIVE
Glucose, UA: NEGATIVE
Ketones, UA: NEGATIVE
Leukocytes,UA: NEGATIVE
Nitrite, UA: NEGATIVE
Protein,UA: NEGATIVE
RBC, UA: NEGATIVE
Specific Gravity, UA: 1.015 (ref 1.005–1.030)
Urobilinogen, Ur: 0.2 mg/dL (ref 0.2–1.0)
pH, UA: 6 (ref 5.0–7.5)

## 2022-02-18 NOTE — Progress Notes (Signed)
Subjective: 1. History of prostate cancer      02/18/22: Andrew Gordon returns for the history below.  His PSA remains <0.1.  He is doing well with rare nocturia and an IPSS of 1.  His UA is clear and he has had no dysuria or hematuria.  He has no GI complaints.  His weight is down slightly but he has had no bone pain.    6/22/23Juanda Gordon returns in f/u for his history of prostate cancer treatred with ST- ADT and a seed implant that he had in 12/21.  His PSA remains <0.1.  He has had no hematuria. He is voiding well with an IPSS of 2.  He has no GI complaints.  He has no bone pain or unintentional weight loss.  UA is clear.   02/26/21: Andrew Gordon returns today in f/u for the history below.   His PSA is <0.1.  The testosterone is back to to normal at 524.  He has rare nocturia.  He has a good stream.  He has no hematuria.  He has no GI complaints.  He has no weight loss.  He has no bone pain.     06/19/20: Andrew Gordon returns today in f/u from his recent seed implant on 02/21/20.    He is voiding well with an IPSS of 2.  He had adjuvant ADT with Lupron 01/18/20 and an  initial Mills Koller a month prior  He had no hot flashes.  His testosterone level remains low at <3 and his PSA is <0.1  His prostate biopsy on 11/09/19 that demonstrated 5/6 Right and 1/6 left cores with cancer.  2 on the left with GG3 and the remainder with GG2.  The prostate volume was 46.56m.   He has T2a N0 M0 with a right base nodule and a negative CT and Bonescan.   Andrew Gordon a 71yo male who is sent in consultation by Dr. AMonico Blitzfor an elevated PSA that was 6.5 in May and 6.6. in June with an 11% f/t ratio.  He has no other GU history.   He [place]'s [place]  His IPSS is 3.        ROS:  Review of Systems  All other systems reviewed and are negative.   Allergies  Allergen Reactions   Beef-Derived Products Hives   Pork-Derived Products Hives    Past Medical History:  Diagnosis Date   Arthritis     hands   Hypertension    followed by pcp   Lyme disease 2010   per pt dx 2010 after tick bite, stated had mild symptoms that resolved other than since then gets hive when eats beef / pork   Nocturia    Prostate cancer ([facility] urologist--- dr Vallorie Niccoli/  oncologist--- dr mTammi Klippel  dx 11-09-2019,  Stage T2a, Gleason 4+3    Past Surgical History:  Procedure Laterality Date   CYSTOSCOPY  02/21/2020   Procedure: CYSTOSCOPY FLEXIBLE;  Surgeon: WIrine Seal MD;  [facility]  Service: Urology;;  No seeds detected in bladder per Dr. WJeffie Pollock  NO PAST SURGERIES     PROSTATE BIOPSY     RADIOACTIVE SEED IMPLANT N/A 02/21/2020   Procedure: RADIOACTIVE SEED IMPLANT/BRACHYTHERAPY IMPLANT;  Surgeon: WIrine Seal MD;  [facility]  Service: [facility]rology;  Laterality: N/A;   SPACE OAR INSTILLATION N/A 02/21/2020   Procedure: SPACE OAR INSTILLATION;  Surgeon: WIrine Seal MD;  Location: [place];  Service: Urology;  Laterality: N/A;    Social History   Socioeconomic History   Marital status: Widowed    Spouse name: Not on file   Number of children: 1   Years of education: Not on file   Highest education level: Not on file  Occupational History   Occupation: retired  Tobacco Use   Smoking status: Every Day    Packs/day: 0.25    Years: 30.00    Total pack years: 7.50    Types: Cigarettes   Smokeless tobacco: Never   Tobacco comments:    12-09-20212 per pt 5 cig per day  Vaping Use   Vaping Use: Never used  Substance and Sexual Activity   Alcohol use: Not Currently   Drug use: Never   Sexual activity: Not Currently    Comment: widowed. no interested.   Other Topics Concern   Not on file  Social History Narrative   Not on file   Social Determinants of Health   Financial Resource Strain: Not on file  Food Insecurity: Not on file  Transportation Needs: Not on file  Physical Activity: Not on file  Stress: Not on file  Social  Connections: Not on file  Intimate Partner Violence: Not on file    Family History  Problem Relation Age of Onset   Breast cancer Mother    Breast cancer Paternal Aunt    Prostate cancer Neg Hx    Pancreatic cancer Neg Hx    Colon cancer Neg Hx     Anti-infectives: Anti-infectives (From admission, onward)    None       Current Outpatient Medications  Medication Sig Dispense Refill   amLODipine (NORVASC) 10 MG tablet Take 10 mg by mouth at bedtime.     aspirin 81 MG chewable tablet Chew 81 mg by mouth daily.     diphenhydrAMINE (BENADRYL) 50 MG tablet Take 50 mg by mouth every 6 (six) hours as needed for itching.     HYDROcodone-acetaminophen (NORCO) 5-325 MG tablet Take 1 tablet by mouth every 6 (six) hours as needed for moderate pain. 8 tablet 0   losartan-hydrochlorothiazide (HYZAAR) 100-25 MG tablet Take 1 tablet by mouth daily.     naproxen sodium (ALEVE) 220 MG tablet Take 220 mg by mouth daily.     Current Facility-Administered Medications  Medication Dose Route Frequency Provider Last Rate Last Admin   degarelix (FIRMAGON) injection 240 mg  240 mg Subcutaneous Once Andrew Seal, MD         Objective: Vital signs in last 24 hours: BP 131/76   Pulse 74   Intake/Output from previous day: No intake/output data recorded. Intake/Output this shift: '@IOTHISSHIFT'$ @   Physical Exam Vitals reviewed.  Constitutional:      Appearance: Normal appearance.  Neurological:     Mental Status: He is alert.     Lab Results:  Recent Results (from the past 2160 hour(s))  PSA     Status: None   Collection Time: 02/11/22  8:44 AM  Result Value Ref Range   Prostate Specific Ag, Serum <0.1 0.0 - 4.0 ng/mL    Comment: **Verified by repeat analysis** Roche ECLIA methodology. According to the American Urological Association, Serum PSA should decrease and remain at undetectable levels after radical prostatectomy. The AUA defines biochemical recurrence as an initial PSA value  0.2 ng/mL or greater followed by a subsequent confirmatory PSA value 0.2 ng/mL or greater. Values obtained with different assay methods or kits  cannot be used interchangeably. Results cannot be interpreted as absolute evidence of the presence or absence of malignant disease.   Urinalysis, Routine w reflex microscopic     Status: None   Collection Time: 02/18/22 10:14 AM  Result Value Ref Range   Specific Gravity, UA 1.015 1.005 - 1.030   pH, UA 6.0 5.0 - 7.5   Color, UA Yellow Yellow   Appearance Ur Clear Clear   Leukocytes,UA Negative Negative   Protein,UA Negative Negative/Trace   Glucose, UA Negative Negative   Ketones, UA Negative Negative   RBC, UA Negative Negative   Bilirubin, UA Negative Negative   Urobilinogen, Ur 0.2 0.2 - 1.0 mg/dL   Nitrite, UA Negative Negative   Microscopic Examination Comment     Comment: Microscopic not indicated and not performed.  UA is clear  Studies/Results: Results for orders placed or performed in visit on 02/18/22 (from the past 24 hour(s))  Urinalysis, Routine w reflex microscopic     Status: None   Collection Time: 02/18/22 10:14 AM  Result Value Ref Range   Specific Gravity, UA 1.015 1.005 - 1.030   pH, UA 6.0 5.0 - 7.5   Color, UA Yellow Yellow   Appearance Ur Clear Clear   Leukocytes,UA Negative Negative   Protein,UA Negative Negative/Trace   Glucose, UA Negative Negative   Ketones, UA Negative Negative   RBC, UA Negative Negative   Bilirubin, UA Negative Negative   Urobilinogen, Ur 0.2 0.2 - 1.0 mg/dL   Nitrite, UA Negative Negative   Microscopic Examination Comment    Narrative   Performed at:  [facility] [address] [place], [place]  [id number] Lab Director: Mina Marble MT, Phone:  [phone]      Assessment/Plan: 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   He is doing well s/p  ADT and a seed implant.  His  PSA remains undetectible at <0.1.   He will return in 6 mo with a  PSA.   No orders of the defined types were placed in this encounter.    Orders Placed This Encounter  Procedures   Urinalysis, Routine w reflex microscopic   PSA    Standing Status:   Future    Standing Expiration Date:   02/19/2023     Return in about 6 months (around 08/20/2022) for with PSA.    CC: Dr. Monico Blitz.    Andrew Gordon 02/19/2022 [phone] ID: Andrew Gordon, male   DOB: 03-10-1950, 71 y.o.   MRN: [id number] Patient ID: Andrew Gordon, male   DOB: 08-Jul-1950, 71 y.o.   MRN: [id number]

## 2022-02-19 DIAGNOSIS — R69 Illness, unspecified: Secondary | ICD-10-CM | POA: Diagnosis not present

## 2022-02-19 DIAGNOSIS — I10 Essential (primary) hypertension: Secondary | ICD-10-CM | POA: Diagnosis not present

## 2022-02-19 DIAGNOSIS — Z299 Encounter for prophylactic measures, unspecified: Secondary | ICD-10-CM | POA: Diagnosis not present

## 2022-02-19 DIAGNOSIS — F17210 Nicotine dependence, cigarettes, uncomplicated: Secondary | ICD-10-CM | POA: Diagnosis not present

## 2022-03-13 IMAGING — CT CT ABD-PELV W/ CM
2 of 5 series · 15 of 46 positions shown, 17 images · IV contrast (APPLIED)
Comparison: None.

CLINICAL DATA: Prostate cancer, Gleason 7 (4+3), diagnosed last
month. No previous relevant surgery.

EXAM:
CT ABDOMEN AND PELVIS WITH CONTRAST
TECHNIQUE: Multidetector CT imaging of the abdomen and pelvis was performed
using the standard protocol following bolus administration of
intravenous contrast.
CONTRAST:  100mL OMNIPAQUE IOHEXOL 300 MG/ML  SOLN

[Series 2: axial st · axial · 0.98mm/px · z∈[+1021,+1471]mm · 12 of 108 slices shown, 14 images]
[im 9/108  soft-tissue]
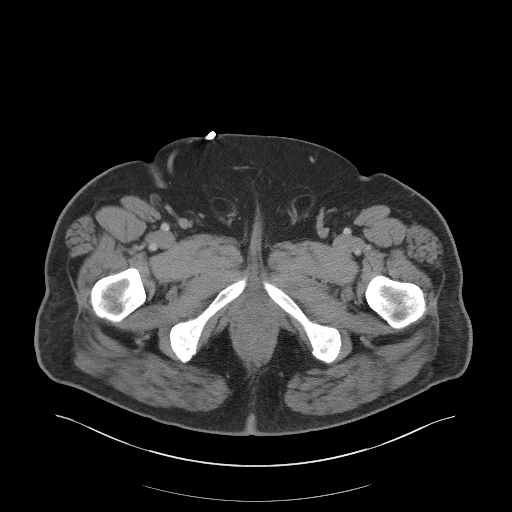
[im 9/108  bone]
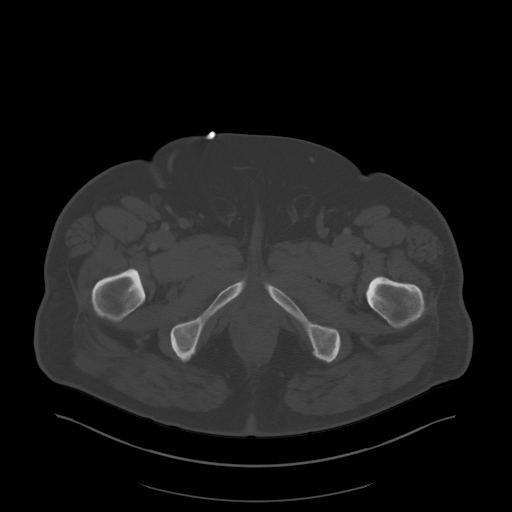
[im 17/108  soft-tissue]
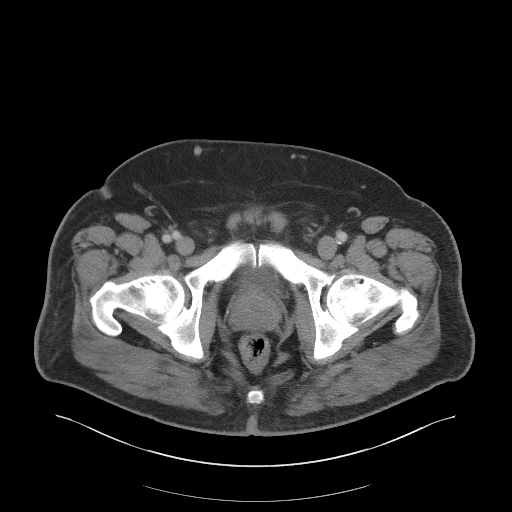
[im 25/108  soft-tissue]
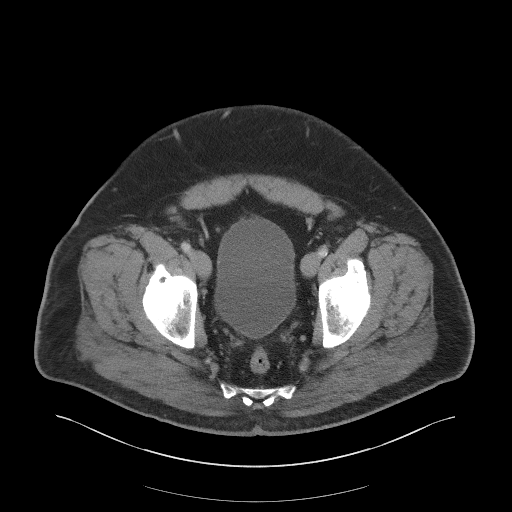
[im 33/108  soft-tissue]
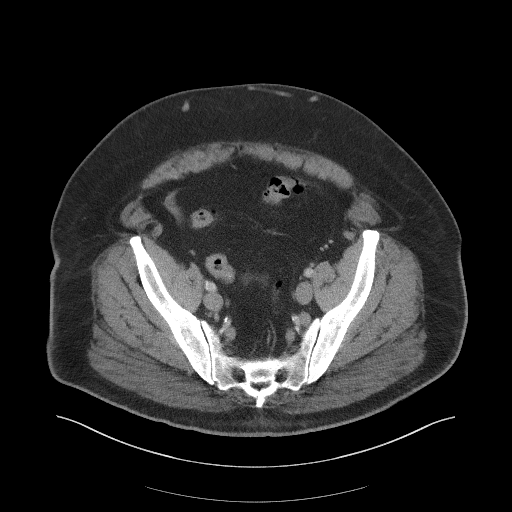
[im 42/108  soft-tissue]
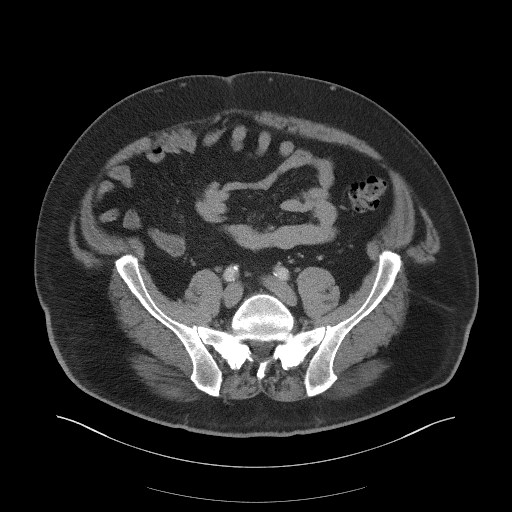
[im 50/108  soft-tissue]
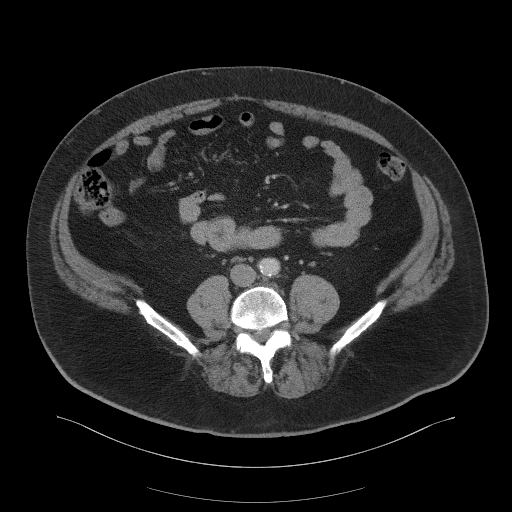
[im 58/108  soft-tissue]
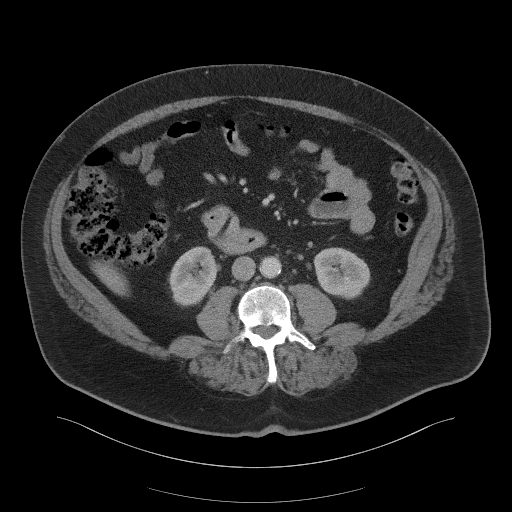
[im 66/108  soft-tissue]
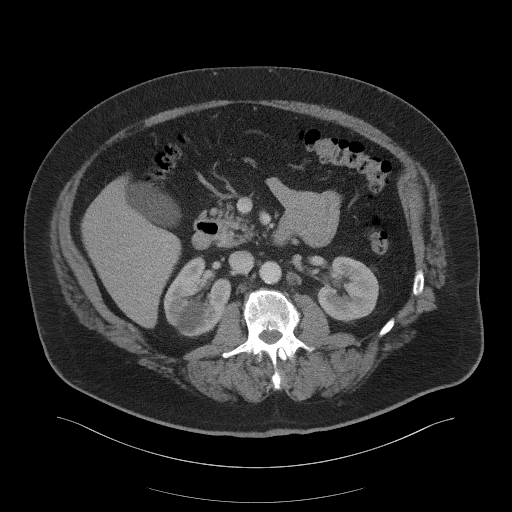
[im 75/108  soft-tissue]
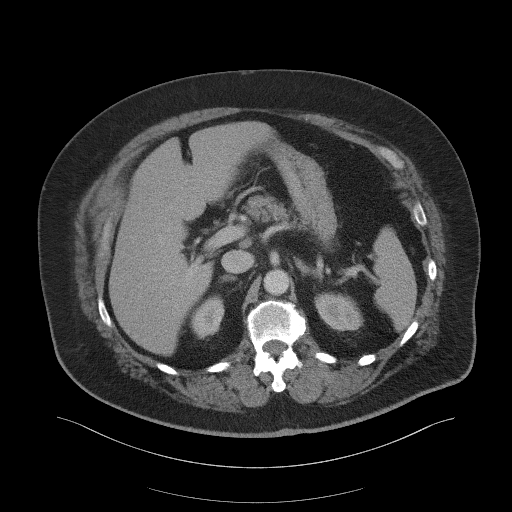
[im 75/108  bone]
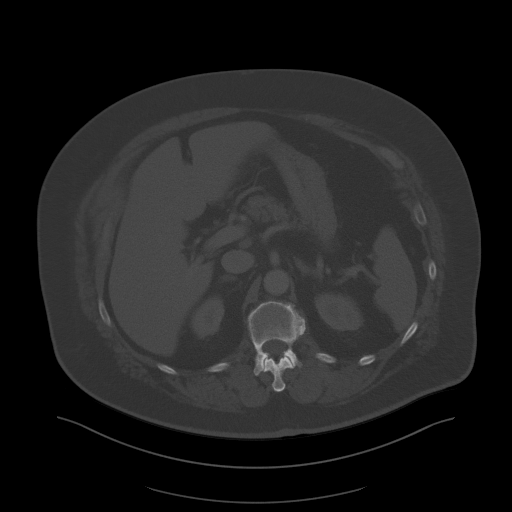
[im 83/108  soft-tissue]
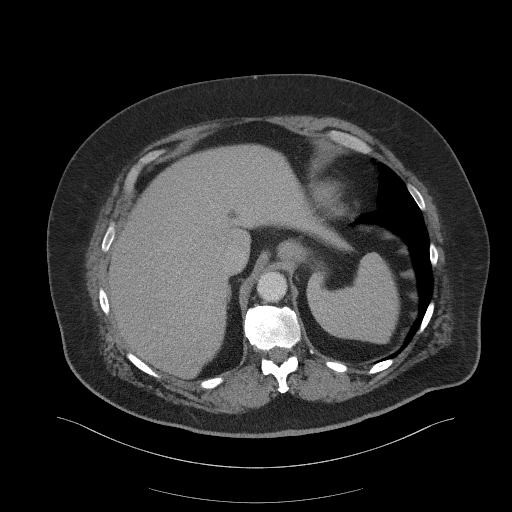
[im 91/108  soft-tissue]
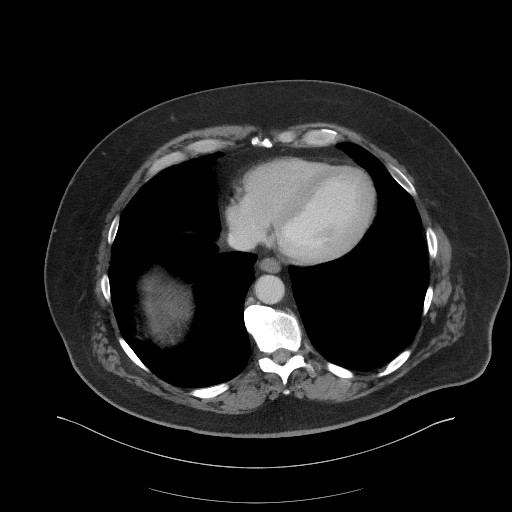
[im 99/108  soft-tissue]
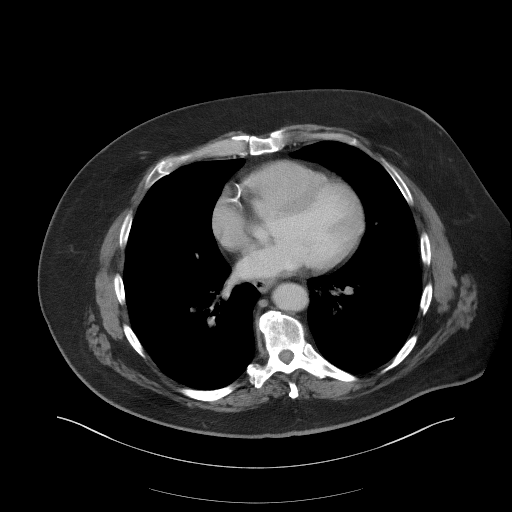

[Series 5: coronal st · coronal · 1.05mm/px · 3 of 129 slices shown]
[im 43/129  soft-tissue]
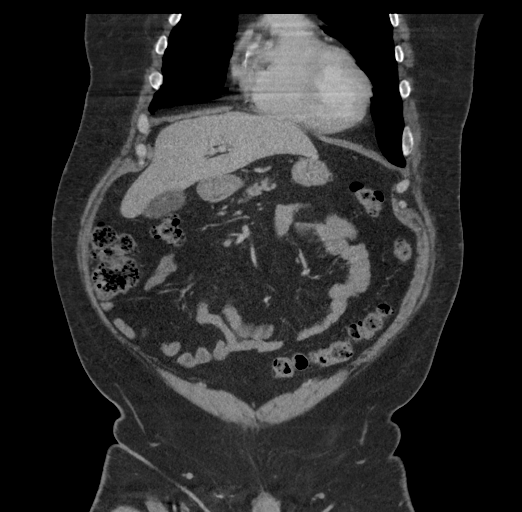
[im 57/129  soft-tissue]
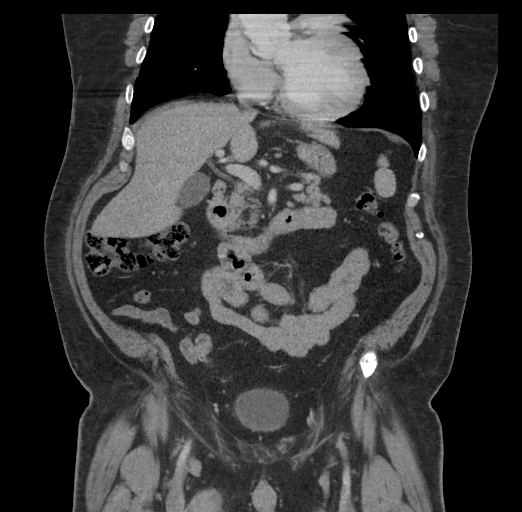
[im 72/129  soft-tissue]
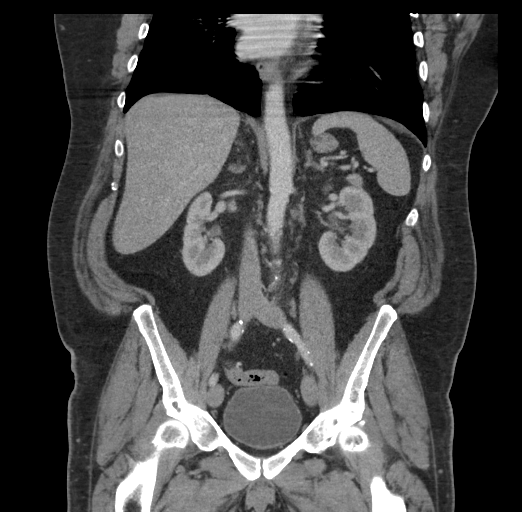

[15 of 46 positions shown; findings below may reference images not displayed]

FINDINGS: Lower chest: Evaluation of the lung bases is limited by breathing
artifact. There are probable mild emphysematous changes as well as
coronary artery calcifications.

Hepatobiliary: The liver is normal in density without suspicious
focal abnormality. No evidence of gallstones, gallbladder wall
thickening or biliary dilatation.

Pancreas: Unremarkable. No pancreatic ductal dilatation or
surrounding inflammatory changes.

Spleen: Normal in size without focal abnormality.

Adrenals/Urinary Tract: Both adrenal glands appear normal. Punctate
calcification in the upper pole of the right kidney on coronal image
76/15 is probably renovascular. No definite urinary tract
calcifications. There are bilateral renal cysts, largest in the
interpolar region of the right kidney, measuring 4.4 cm. No evidence
of enhancing renal mass or hydronephrosis. The bladder appears
normal.

Stomach/Bowel: No evidence of bowel wall thickening, distention or
surrounding inflammatory change. Vestigial appearing appendix
without acute inflammation. Minimal distal colonic diverticulosis.
Small duodenal diverticula.

Vascular/Lymphatic: There are no enlarged abdominal or pelvic lymph
nodes. There are small lymph nodes within the porta hepatis and
retroperitoneum which are not pathologically enlarged. Mild aortic
and branch vessel atherosclerosis without acute vascular findings.
The portal, superior mesenteric and splenic veins are patent.

Reproductive: No focal abnormality of the prostate gland or seminal
vesicles by CT.

Other: No evidence of abdominal wall mass or hernia. No ascites.

Musculoskeletal: No acute or significant osseous findings. No
evidence of osseous metastatic disease. Mild degenerative changes in
the spine and both hips with small subchondral cysts in the
acetabula.
IMPRESSION: 1. No evidence of metastatic prostate cancer.
2. Bilateral renal cysts.
3. Aortic Atherosclerosis ([id number]).

## 2022-05-16 IMAGING — DX DG CHEST 2V
1 series · 1 of 1 positions shown · non-contrast
Comparison: None.

CLINICAL DATA: Preop for radioactive seed implant.

EXAM:
CHEST - 2 VIEW

[chest lat]
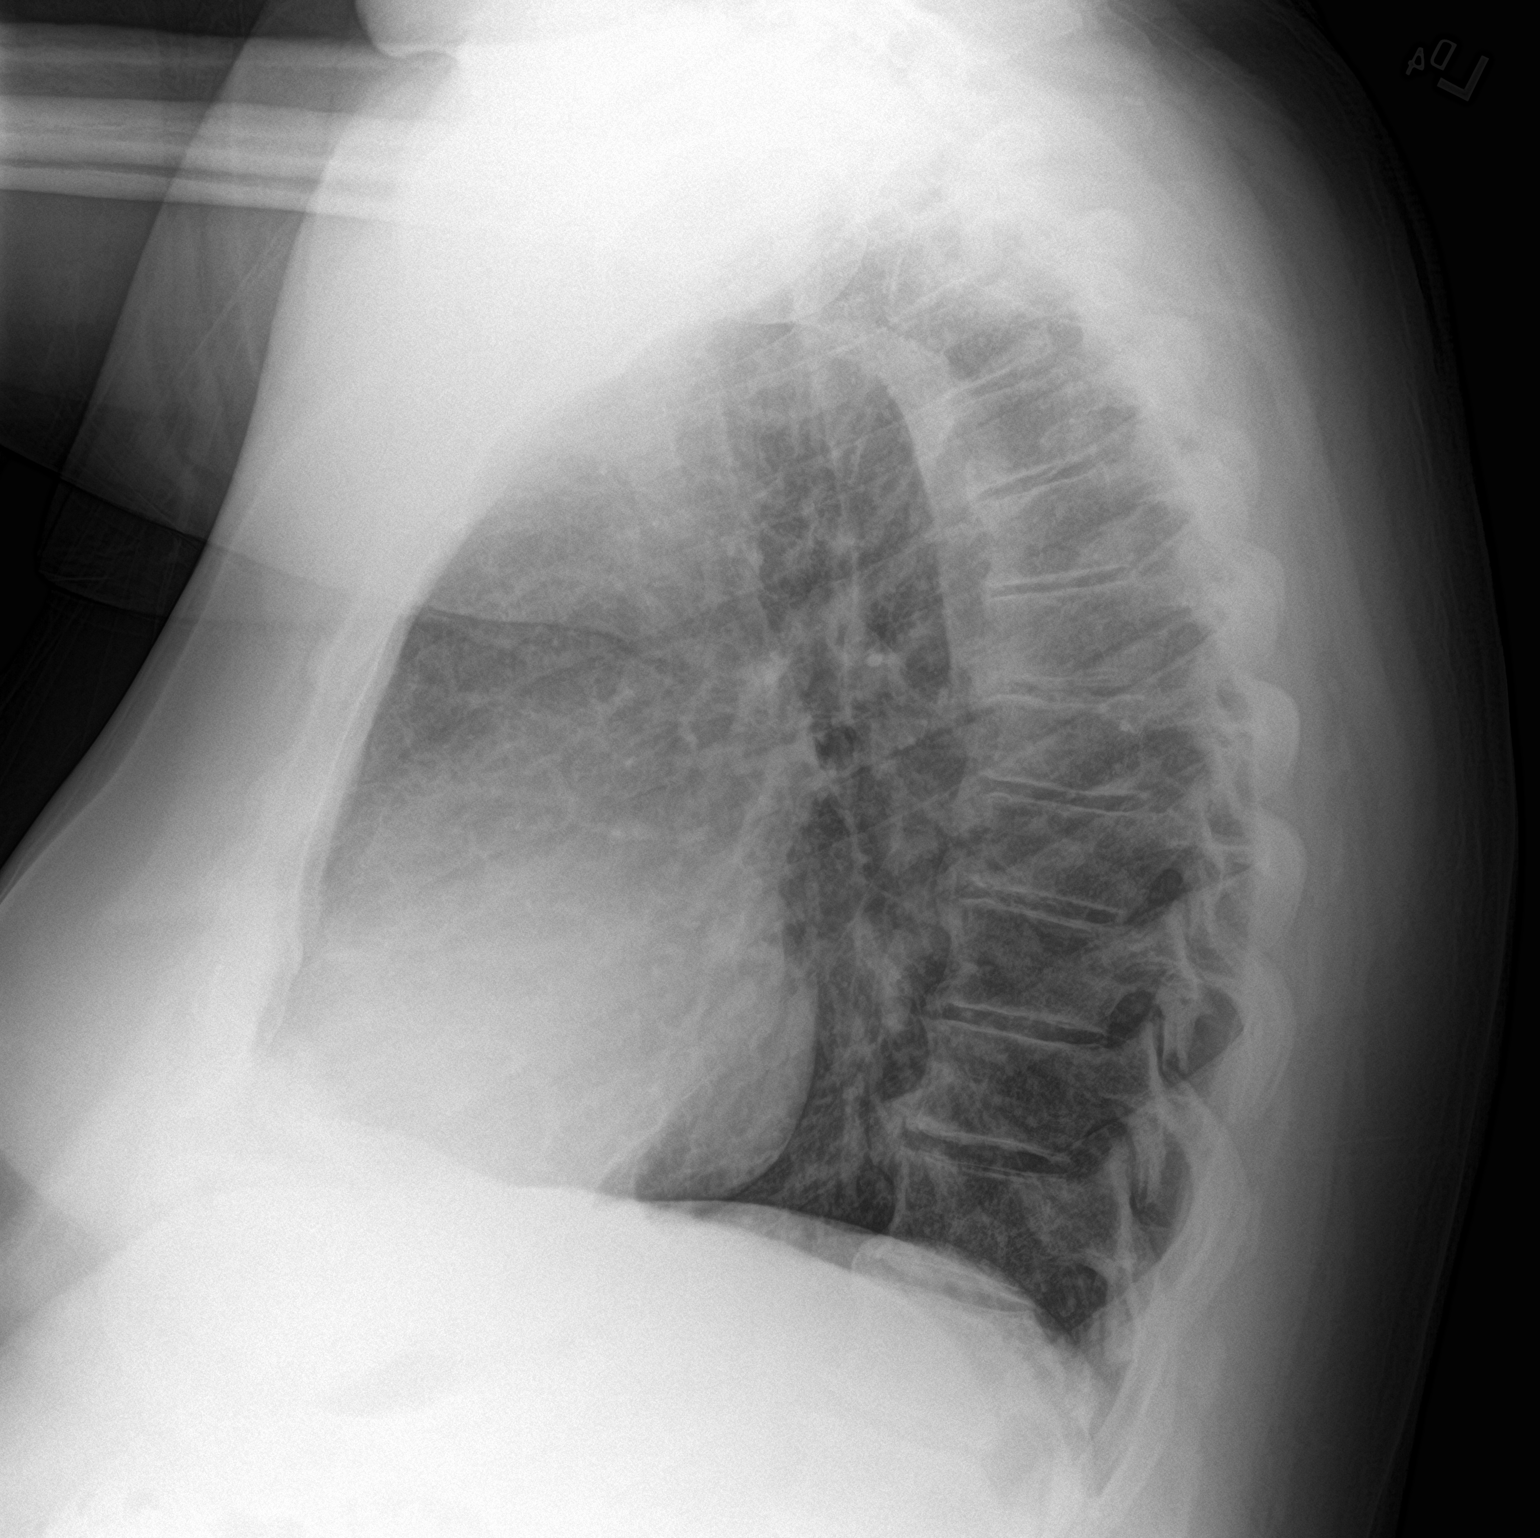

[1 of 1 positions shown; findings below may reference images not displayed]

FINDINGS: Upper normal heart size. There is mild aortic tortuosity. Mild
hyperinflation and bronchial thickening. No focal airspace disease.
No pulmonary mass or evidence of nodule. No pleural fluid or
pneumothorax. Mild thoracic spondylosis. No acute osseous
abnormalities are seen.
IMPRESSION: Mild hyperinflation and bronchial thickening, may represent
bronchitis, asthma, or related smoking.

## 2022-08-09 DIAGNOSIS — Z1339 Encounter for screening examination for other mental health and behavioral disorders: Secondary | ICD-10-CM | POA: Diagnosis not present

## 2022-08-09 DIAGNOSIS — F17210 Nicotine dependence, cigarettes, uncomplicated: Secondary | ICD-10-CM | POA: Diagnosis not present

## 2022-08-09 DIAGNOSIS — I10 Essential (primary) hypertension: Secondary | ICD-10-CM | POA: Diagnosis not present

## 2022-08-09 DIAGNOSIS — Z7189 Other specified counseling: Secondary | ICD-10-CM | POA: Diagnosis not present

## 2022-08-09 DIAGNOSIS — Z6835 Body mass index (BMI) 35.0-35.9, adult: Secondary | ICD-10-CM | POA: Diagnosis not present

## 2022-08-09 DIAGNOSIS — Z0000 Encounter for general adult medical examination without abnormal findings: Secondary | ICD-10-CM | POA: Diagnosis not present

## 2022-08-09 DIAGNOSIS — Z1331 Encounter for screening for depression: Secondary | ICD-10-CM | POA: Diagnosis not present

## 2022-08-09 DIAGNOSIS — Z299 Encounter for prophylactic measures, unspecified: Secondary | ICD-10-CM | POA: Diagnosis not present

## 2022-10-19 ENCOUNTER — Other Ambulatory Visit: Payer: Medicare HMO

## 2022-10-19 DIAGNOSIS — Z8546 Personal history of malignant neoplasm of prostate: Secondary | ICD-10-CM

## 2022-10-28 ENCOUNTER — Encounter: Payer: Self-pay | Admitting: Urology

## 2022-10-28 ENCOUNTER — Ambulatory Visit: Payer: Medicare HMO | Admitting: Urology

## 2022-10-28 VITALS — BP 122/72 | HR 84

## 2022-10-28 DIAGNOSIS — Z8546 Personal history of malignant neoplasm of prostate: Secondary | ICD-10-CM | POA: Diagnosis not present

## 2022-10-28 NOTE — Progress Notes (Signed)
Subjective: 1. History of prostate cancer      10/28/22: Andrew Gordon returns today in f/u.  His PSA remains undetectible at <0.1.  He is doing well with an IPSS of 1 with nocturia.  He has had no hematuria or bloody stools.   He has lost 3 more lbs with effort.  He has no bone pain.     12/14/23[name] returns for the history below.  His PSA remains <0.1.  He is doing well with rare nocturia and an IPSS of 1.  His UA is clear and he has had no dysuria or hematuria.  He has no GI complaints.  His weight is down slightly but he has had no bone pain.    6/22/23[name] returns in f/u for his history of prostate cancer treatred with ST- ADT and a seed implant that he had in 12/21.  His PSA remains <0.1.  He has had no hematuria. He is voiding well with an IPSS of 2.  He has no GI complaints.  He has no bone pain or unintentional weight loss.  UA is clear.   02/26/21: Andrew Gordon returns today in f/u for the history below.   His PSA is <0.1.  The testosterone is back to to normal at 524.  He has rare nocturia.  He has a good stream.  He has no hematuria.  He has no GI complaints.  He has no weight loss.  He has no bone pain.     06/19/20: Andrew Gordon returns today in f/u from his recent seed implant on 02/21/20.    He is voiding well with an IPSS of 2.  He had adjuvant ADT with Lupron 01/18/20 and an  initial Andrew Gordon a month prior  He had no hot flashes.  His testosterone level remains low at <3 and his PSA is <0.1  His prostate biopsy on 11/09/19 that demonstrated 5/6 Right and 1/6 left cores with cancer.  2 on the left with GG3 and the remainder with GG2.  The prostate volume was 46.89ml.   He has T2a N0 M0 with a right base nodule and a negative CT and Bonescan.   Andrew Gordon is a 72 yo male who is sent in consultation by Andrew Gordon for an elevated PSA that was 6.5 in May and 6.6. in June with an 11% f/t ratio.  He has no other GU history.   He has had prior normal PSA's in [place] in the past.  His IPSS is 3.         ROS:  Review of Systems  All other systems reviewed and are negative.   Allergies  Allergen Reactions   Beef-Derived Products Hives   Pork-Derived Products Hives    Past Medical History:  Diagnosis Date   Arthritis    hands   Hypertension    followed by pcp   Lyme disease 2010   per pt dx 2010 after tick bite, stated had mild symptoms that resolved other than since then gets hive when eats beef / pork   Nocturia    Prostate cancer Andrew Gordon) urologist--- dr Kadance Mccuistion/  oncologist--- dr Kathrynn Running   dx 11-09-2019,  Stage T2a, Gleason 4+3    Past Surgical History:  Procedure Laterality Date   CYSTOSCOPY  02/21/2020   Procedure: CYSTOSCOPY FLEXIBLE;  Surgeon: Andrew Pippin, MD;  Location: [facility];  Service: Urology;;  No seeds detected in bladder per Dr. Annabell Howells   NO PAST SURGERIES  PROSTATE BIOPSY     RADIOACTIVE SEED IMPLANT N/A 02/21/2020   Procedure: RADIOACTIVE SEED IMPLANT/BRACHYTHERAPY IMPLANT;  Surgeon: Andrew Pippin, MD;  Location: [facility];  Service: Urology;  Laterality: N/A;   SPACE OAR INSTILLATION N/A 02/21/2020   Procedure: SPACE OAR INSTILLATION;  Surgeon: Andrew Pippin, MD;  Location: [facility];  Service: Urology;  Laterality: N/A;    Social History   Socioeconomic History   Marital status: Widowed    Spouse name: Not on file   Number of children: 1   Years of education: Not on file   Highest education level: Not on file  Occupational History   Occupation: retired  Tobacco Use   Smoking status: Every Day    Current packs/day: 0.25    Average packs/day: 0.3 packs/day for 30.0 years (7.5 ttl pk-yrs)    Types: Cigarettes   Smokeless tobacco: Never   Tobacco comments:    12-09-20212 per pt 5 cig per day  Vaping Use   Vaping status: Never Used  Substance and Sexual Activity   Alcohol use: Not Currently   Drug use: Never   Sexual activity: Not Currently    Comment: widowed. no interested.   Other  Topics Concern   Not on file  Social History Narrative   Not on file   Social Determinants of Health   Financial Resource Strain: Not on file  Food Insecurity: Not on file  Transportation Needs: Not on file  Physical Activity: Not on file  Stress: Not on file  Social Connections: Not on file  Intimate Partner Violence: Not on file    Family History  Problem Relation Age of Onset   Breast cancer Mother    Breast cancer Paternal Aunt    Prostate cancer Neg Hx    Pancreatic cancer Neg Hx    Colon cancer Neg Hx     Anti-infectives: Anti-infectives (From admission, onward)    None       Current Outpatient Medications  Medication Sig Dispense Refill   amLODipine (NORVASC) 10 MG tablet Take 10 mg by mouth at bedtime.     aspirin 81 MG chewable tablet Chew 81 mg by mouth daily.     diphenhydrAMINE (BENADRYL) 50 MG tablet Take 50 mg by mouth every 6 (six) hours as needed for itching.     HYDROcodone-acetaminophen (NORCO) 5-325 MG tablet Take 1 tablet by mouth every 6 (six) hours as needed for moderate pain. 8 tablet 0   losartan-hydrochlorothiazide (HYZAAR) 100-25 MG tablet Take 1 tablet by mouth daily.     naproxen sodium (ALEVE) 220 MG tablet Take 220 mg by mouth daily.     Current Facility-Administered Medications  Medication Dose Route Frequency Provider Last Rate Last Admin   degarelix (FIRMAGON) injection 240 mg  240 mg Subcutaneous Once Andrew Pippin, MD         Objective: Vital signs in last 24 hours: BP 122/72   Pulse 84   Intake/Output from previous day: No intake/output data recorded. Intake/Output this shift: @IOTHISSHIFT @   Physical Exam Vitals reviewed.  Constitutional:      Appearance: Normal appearance.  Neurological:     Mental Status: He is alert.     Lab Results:  Recent Results (from the past 2160 hour(s))  PSA     Status: None   Collection Time: 10/19/22 12:48 PM  Result Value Ref Range   Prostate Specific Ag, Serum <0.1 0.0 - 4.0  ng/mL    Comment: Roche ECLIA methodology.  According to the American Urological Association, Serum PSA should decrease and remain at undetectable levels after radical prostatectomy. The AUA defines biochemical recurrence as an initial PSA value 0.2 ng/mL or greater followed by a subsequent confirmatory PSA value 0.2 ng/mL or greater. Values obtained with different assay methods or kits cannot be used interchangeably. Results cannot be interpreted as absolute evidence of the presence or absence of malignant disease.   Urinalysis, Routine w reflex microscopic     Status: None   Collection Time: 10/28/22  3:27 PM  Result Value Ref Range   Specific Gravity, UA 1.015 1.005 - 1.030   pH, UA 6.0 5.0 - 7.5   Color, UA Yellow Yellow   Appearance Ur Clear Clear   Leukocytes,UA Negative Negative   Protein,UA Negative Negative/Trace   Glucose, UA Negative Negative   Ketones, UA Negative Negative   RBC, UA Negative Negative   Bilirubin, UA Negative Negative   Urobilinogen, Ur 0.2 0.2 - 1.0 mg/dL   Nitrite, UA Negative Negative   Microscopic Examination Comment     Comment: Microscopic not indicated and not performed.  UA is clear  Studies/Results: No results found for this or any previous visit (from the past 24 hour(s)).     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   He is doing well s/p  ADT and a seed implant.  His  PSA remains undetectible at <0.1.   He will return in 6 mo with a PSA.   No orders of the defined types were placed in this encounter.    Orders Placed This Encounter  Procedures   Urinalysis, Routine w reflex microscopic   PSA    Standing Status:   Future    Standing Expiration Date:   10/28/2023     Return in about 6 months (around 04/30/2023) for with PSA.    CC: Andrew Gordon.    Andrew Gordon 11/01/2022 [phone] ID: Andrew Gordon, male   DOB: Jul 14, 1950, 72 y.o.   MRN: [id number] Patient ID: Andrew Gordon, male   DOB: [birth year], 72 y.o.   MRN: [id number]

## 2022-10-29 LAB — URINALYSIS, ROUTINE W REFLEX MICROSCOPIC
Bilirubin, UA: NEGATIVE
Glucose, UA: NEGATIVE
Ketones, UA: NEGATIVE
Leukocytes,UA: NEGATIVE
Nitrite, UA: NEGATIVE
Protein,UA: NEGATIVE
RBC, UA: NEGATIVE
Specific Gravity, UA: 1.015 (ref 1.005–1.030)
Urobilinogen, Ur: 0.2 mg/dL (ref 0.2–1.0)
pH, UA: 6 (ref 5.0–7.5)

## 2022-11-18 DIAGNOSIS — Z125 Encounter for screening for malignant neoplasm of prostate: Secondary | ICD-10-CM | POA: Diagnosis not present

## 2022-11-18 DIAGNOSIS — I10 Essential (primary) hypertension: Secondary | ICD-10-CM | POA: Diagnosis not present

## 2022-11-18 DIAGNOSIS — F17210 Nicotine dependence, cigarettes, uncomplicated: Secondary | ICD-10-CM | POA: Diagnosis not present

## 2022-11-18 DIAGNOSIS — Z0000 Encounter for general adult medical examination without abnormal findings: Secondary | ICD-10-CM | POA: Diagnosis not present

## 2022-11-18 DIAGNOSIS — Z79899 Other long term (current) drug therapy: Secondary | ICD-10-CM | POA: Diagnosis not present

## 2022-11-18 DIAGNOSIS — E7800 Pure hypercholesterolemia, unspecified: Secondary | ICD-10-CM | POA: Diagnosis not present

## 2022-11-18 DIAGNOSIS — Z299 Encounter for prophylactic measures, unspecified: Secondary | ICD-10-CM | POA: Diagnosis not present

## 2023-04-22 ENCOUNTER — Other Ambulatory Visit: Payer: Medicare HMO

## 2023-04-22 DIAGNOSIS — Z8546 Personal history of malignant neoplasm of prostate: Secondary | ICD-10-CM | POA: Diagnosis not present

## 2023-04-23 LAB — PSA: Prostate Specific Ag, Serum: <0.1 ng/mL (ref 0.0–4.0)

## 2023-04-28 ENCOUNTER — Encounter: Payer: Self-pay | Admitting: Urology

## 2023-04-28 ENCOUNTER — Ambulatory Visit: Payer: Medicare HMO | Admitting: Urology

## 2023-04-28 VITALS — BP 159/80 | HR 71

## 2023-04-28 DIAGNOSIS — Z8546 Personal history of malignant neoplasm of prostate: Secondary | ICD-10-CM

## 2023-04-28 DIAGNOSIS — C61 Malignant neoplasm of prostate: Secondary | ICD-10-CM | POA: Diagnosis not present

## 2023-04-28 LAB — URINALYSIS, ROUTINE W REFLEX MICROSCOPIC
Bilirubin, UA: NEGATIVE
Glucose, UA: NEGATIVE
Ketones, UA: NEGATIVE
Leukocytes,UA: NEGATIVE
Nitrite, UA: NEGATIVE
Protein,UA: NEGATIVE
RBC, UA: NEGATIVE
Specific Gravity, UA: 1.015 (ref 1.005–1.030)
Urobilinogen, Ur: 0.2 mg/dL (ref 0.2–1.0)
pH, UA: 6 (ref 5.0–7.5)

## 2023-04-28 NOTE — Progress Notes (Unsigned)
Subjective: 1. History of prostate cancer     04/28/23: Andrew Gordon returns today in f/u. His PSA remains undetectible at <0.1 following seeds in 12/21 with ST ADT. He is voiding well with an iPSS of 1 and nocturia x 1.  He has no hematuria.  His weight is stable.  He has no bone pain.  He has no GI complaints.  .   8/22/24[name] returns today in f/u.  His PSA remains undetectible at <0.1.  He is doing well with an IPSS of 1 with nocturia.  He has had no hematuria or bloody stools.   He has lost 3 more lbs with effort.  He has no bone pain.     12/14/23[name] returns for the history below.  His PSA remains <0.1.  He is doing well with rare nocturia and an IPSS of 1.  His UA is clear and he has had no dysuria or hematuria.  He has no GI complaints.  His weight is down slightly but he has had no bone pain.    6/22/23[name] returns in f/u for his history of prostate cancer treatred with ST- ADT and a seed implant that he had in 12/21.  His PSA remains <0.1.  He has had no hematuria. He is voiding well with an IPSS of 2.  He has no GI complaints.  He has no bone pain or unintentional weight loss.  UA is clear.   02/26/21: Andrew Gordon returns today in f/u for the history below.   His PSA is <0.1.  The testosterone is back to to normal at 524.  He has rare nocturia.  He has a good stream.  He has no hematuria.  He has no GI complaints.  He has no weight loss.  He has no bone pain.     06/19/20: Andrew Gordon returns today in f/u from his recent seed implant on 02/21/20.    He is voiding well with an IPSS of 2.  He had adjuvant ADT with Lupron 01/18/20 and an  initial Deborra Medina a month prior  He had no hot flashes.  His testosterone level remains low at <3 and his PSA is <0.1  His prostate biopsy on 11/09/19 that demonstrated 5/6 Right and 1/6 left cores with cancer.  2 on the left with GG3 and the remainder with GG2.  The prostate volume was 46.59ml.   He has T2a N0 M0 with a right base nodule and a negative CT and  Bonescan.   Andrew Gordon is a 73 yo male who is sent in consultation by Dr. Kirstie Peri for an elevated PSA that was 6.5 in May and 6.6. in June with an 11% f/t ratio.  He has no other GU history.   He has had prior normal PSA's in [place] in the past.  His IPSS is 3.      IPSS     Row Name 04/28/23 1400         International Prostate Symptom Score   How often have you had the sensation of not emptying your bladder? Not at All     How often have you had to urinate less than every two hours? Not at All     How often have you found you stopped and started again several times when you urinated? Not at All     How often have you found it difficult to postpone urination? Not at All     How often have you had a weak  urinary stream? Not at All     How often have you had to strain to start urination? Not at All     How many times did you typically get up at night to urinate? 1 Time     Total IPSS Score 1       Quality of Life due to urinary symptoms   If you were to spend the rest of your life with your urinary condition just the way it is now how would you feel about that? Delighted               ROS:  Review of Systems  All other systems reviewed and are negative.   Allergies  Allergen Reactions   Beef-Derived Drug Products Hives   Pork-Derived Products Hives    Past Medical History:  Diagnosis Date   Arthritis    hands   Hypertension    followed by pcp   Lyme disease 2010   per pt dx 2010 after tick bite, stated had mild symptoms that resolved other than since then gets hive when eats beef / pork   Nocturia    Prostate cancer [facility]) urologist--- dr Terrin Imparato/  oncologist--- dr Kathrynn Running   dx 11-09-2019,  Stage T2a, Gleason 4+3    Past Surgical History:  Procedure Laterality Date   CYSTOSCOPY  02/21/2020   Procedure: CYSTOSCOPY FLEXIBLE;  Surgeon: Bjorn Pippin, MD;  Location: [facility];  Service: Urology;;  No seeds detected in bladder per Dr. Annabell Howells   NO PAST  SURGERIES     PROSTATE BIOPSY     RADIOACTIVE SEED IMPLANT N/A 02/21/2020   Procedure: RADIOACTIVE SEED IMPLANT/BRACHYTHERAPY IMPLANT;  Surgeon: Bjorn Pippin, MD;  Location: [facility];  Service: Urology;  Laterality: N/A;   SPACE OAR INSTILLATION N/A 02/21/2020   Procedure: SPACE OAR INSTILLATION;  Surgeon: Bjorn Pippin, MD;  Location: [facility];  Service: Urology;  Laterality: N/A;    Social History   Socioeconomic History   Marital status: Widowed    Spouse name: Not on file   Number of children: 1   Years of education: Not on file   Highest education level: Not on file  Occupational History   Occupation: retired  Tobacco Use   Smoking status: Every Day    Current packs/day: 0.25    Average packs/day: 0.3 packs/day for 30.0 years (7.5 ttl pk-yrs)    Types: Cigarettes   Smokeless tobacco: Never   Tobacco comments:    12-09-20212 per pt 5 cig per day  Vaping Use   Vaping status: Never Used  Substance and Sexual Activity   Alcohol use: Not Currently   Drug use: Never   Sexual activity: Not Currently    Comment: widowed. no interested.   Other Topics Concern   Not on file  Social History Narrative   Not on file   Social Drivers of Health   Financial Resource Strain: Not on file  Food Insecurity: Not on file  Transportation Needs: Not on file  Physical Activity: Not on file  Stress: Not on file  Social Connections: Not on file  Intimate Partner Violence: Not on file    Family History  Problem Relation Age of Onset   Breast cancer Mother    Breast cancer Paternal Aunt    Prostate cancer Neg Hx    Pancreatic cancer Neg Hx    Colon cancer Neg Hx     Anti-infectives: Anti-infectives (From admission, onward)    None  Current Outpatient Medications  Medication Sig Dispense Refill   amLODipine (NORVASC) 10 MG tablet Take 10 mg by mouth at bedtime.     aspirin 81 MG chewable tablet Chew 81 mg by mouth daily.      diphenhydrAMINE (BENADRYL) 50 MG tablet Take 50 mg by mouth every 6 (six) hours as needed for itching.     HYDROcodone-acetaminophen (NORCO) 5-325 MG tablet Take 1 tablet by mouth every 6 (six) hours as needed for moderate pain. 8 tablet 0   losartan-hydrochlorothiazide (HYZAAR) 100-25 MG tablet Take 1 tablet by mouth daily.     naproxen sodium (ALEVE) 220 MG tablet Take 220 mg by mouth daily.     Current Facility-Administered Medications  Medication Dose Route Frequency Provider Last Rate Last Admin   degarelix (FIRMAGON) injection 240 mg  240 mg Subcutaneous Once Bjorn Pippin, MD         Objective: Vital signs in last 24 hours: BP (!) 159/80   Pulse 71   Intake/Output from previous day: No intake/output data recorded. Intake/Output this shift: @IOTHISSHIFT @   Physical Exam Vitals reviewed.  Constitutional:      Appearance: Normal appearance.  Neurological:     Mental Status: He is alert.     Lab Results:  Recent Results (from the past 2160 hours)  PSA     Status: None   Collection Time: 04/22/23 11:21 AM  Result Value Ref Range   Prostate Specific Ag, Serum <0.1 0.0 - 4.0 ng/mL    Comment: Roche ECLIA methodology. According to the American Urological Association, Serum PSA should decrease and remain at undetectable levels after radical prostatectomy. The AUA defines biochemical recurrence as an initial PSA value 0.2 ng/mL or greater followed by a subsequent confirmatory PSA value 0.2 ng/mL or greater. Values obtained with different assay methods or kits cannot be used interchangeably. Results cannot be interpreted as absolute evidence of the presence or absence of malignant disease.   Urinalysis, Routine w reflex microscopic     Status: None   Collection Time: 04/28/23  1:57 PM  Result Value Ref Range   Specific Gravity, UA 1.015 1.005 - 1.030   pH, UA 6.0 5.0 - 7.5   Color, UA Yellow Yellow   Appearance Ur Clear Clear   Leukocytes,UA Negative Negative    Protein,UA Negative Negative/Trace   Glucose, UA Negative Negative   Ketones, UA Negative Negative   RBC, UA Negative Negative   Bilirubin, UA Negative Negative   Urobilinogen, Ur 0.2 0.2 - 1.0 mg/dL   Nitrite, UA Negative Negative   Microscopic Examination Comment     Comment: Microscopic not indicated and not performed.  UA is clear  Studies/Results: Results for orders placed or performed in visit on 04/28/23 (from the past 24 hours)  Urinalysis, Routine w reflex microscopic     Status: None   Collection Time: 04/28/23  1:57 PM  Result Value Ref Range   Specific Gravity, UA 1.015 1.005 - 1.030   pH, UA 6.0 5.0 - 7.5   Color, UA Yellow Yellow   Appearance Ur Clear Clear   Leukocytes,UA Negative Negative   Protein,UA Negative Negative/Trace   Glucose, UA Negative Negative   Ketones, UA Negative Negative   RBC, UA Negative Negative   Bilirubin, UA Negative Negative   Urobilinogen, Ur 0.2 0.2 - 1.0 mg/dL   Nitrite, UA Negative Negative   Microscopic Examination Comment    Narrative   Performed at:  01 - [facility] 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   He is doing well s/p  ADT and a seed implant.  His  PSA remains undetectible at <0.1.    I will have him just get his PSA annually with his PCP.   No orders of the defined types were placed in this encounter.    Orders Placed This Encounter  Procedures   Urinalysis, Routine w reflex microscopic     Return if symptoms worsen or fail to improve, for He just needs a PSA annually with his PCP.  Andrew Gordon Kitchen    CC: Dr. Kirstie Peri.    Bjorn Pippin 04/29/2023 [phone] ID: Andrew Gordon, male   DOB: [birth year], 73 y.o.   MRN: [id number] Patient ID: Andrew Gordon, male   DOB: 08-15-1950, 73 y.o.   MRN: [id number]

## 2023-09-12 DIAGNOSIS — R030 Elevated blood-pressure reading, without diagnosis of hypertension: Secondary | ICD-10-CM | POA: Diagnosis not present

## 2023-09-12 DIAGNOSIS — E669 Obesity, unspecified: Secondary | ICD-10-CM | POA: Diagnosis not present

## 2023-09-12 DIAGNOSIS — T81718A Complication of other artery following a procedure, not elsewhere classified, initial encounter: Secondary | ICD-10-CM | POA: Diagnosis not present

## 2023-09-12 DIAGNOSIS — Z6839 Body mass index (BMI) 39.0-39.9, adult: Secondary | ICD-10-CM | POA: Diagnosis not present

## 2023-12-02 DIAGNOSIS — E7800 Pure hypercholesterolemia, unspecified: Secondary | ICD-10-CM | POA: Diagnosis not present

## 2023-12-02 DIAGNOSIS — Z0000 Encounter for general adult medical examination without abnormal findings: Secondary | ICD-10-CM | POA: Diagnosis not present

## 2023-12-02 DIAGNOSIS — C61 Malignant neoplasm of prostate: Secondary | ICD-10-CM | POA: Diagnosis not present

## 2023-12-13 DIAGNOSIS — Z1211 Encounter for screening for malignant neoplasm of colon: Secondary | ICD-10-CM | POA: Diagnosis not present

## 2023-12-13 DIAGNOSIS — Z1212 Encounter for screening for malignant neoplasm of rectum: Secondary | ICD-10-CM | POA: Diagnosis not present

## 2023-12-18 LAB — COLOGUARD: COLOGUARD: POSITIVE — AB

## 2024-01-02 ENCOUNTER — Encounter: Payer: Self-pay | Admitting: Gastroenterology

## 2024-02-07 ENCOUNTER — Ambulatory Visit: Admitting: Gastroenterology

## 2024-02-07 ENCOUNTER — Other Ambulatory Visit: Payer: Self-pay | Admitting: *Deleted

## 2024-02-07 ENCOUNTER — Encounter: Payer: Self-pay | Admitting: *Deleted

## 2024-02-07 ENCOUNTER — Encounter: Payer: Self-pay | Admitting: Gastroenterology

## 2024-02-07 VITALS — BP 136/80 | HR 77 | Temp 97.6°F | Ht 73.0 in | Wt 302.4 lb

## 2024-02-07 DIAGNOSIS — R195 Other fecal abnormalities: Secondary | ICD-10-CM

## 2024-02-07 DIAGNOSIS — Z1211 Encounter for screening for malignant neoplasm of colon: Secondary | ICD-10-CM | POA: Diagnosis not present

## 2024-02-07 MED ORDER — PEG 3350-KCL-NA BICARB-NACL 420 G PO SOLR: 4000.0000 mL | Freq: Once | ORAL | 0 refills | Status: AC

## 2024-02-07 NOTE — H&P (View-Only) (Signed)
 GI Office Note    Referring Provider: Hairfield, Keavie C, NP Primary Care Physician:  Hairfield, Keavie C  Primary Gastroenterologist: Lamar HERO.Rourk, MD  Chief Complaint   Chief Complaint  Patient presents with   positive cologuard     Positive cologuard    History of Present Illness   Andrew Gordon is a 73 y.o. male presenting today at the request of Hairfield, Keavie C, NP for positive Cologuard.   Normal PSA and negative urinalysis in February 2025.  Positive Cologuard 12/13/2023.  No prior colonoscopy report within our system.  Per review of referral paperwork he had labs completed in September 2025 with hemoglobin 15.7, platelets 274, normal TSH, low HDL but with otherwise normal lipid panel, normal LFTs and normal PSA.  Today:  Discussed the use of AI scribe software for clinical note transcription with the patient, who gave verbal consent to proceed.  He had a colonoscopy 28 years ago in [place], which showed no polyps. He has no family history of colon cancer or colon polyps. He has regular bowel movements, typically once a day, without straining, blood, or melena. He attributes any dark stool to his diet, which is high in vegetables and low in meat. No abdominal pain or changes in appetite, although he notes a gradual decrease in appetite with age.  He has undergone multiple Cologuard tests in the past that have been negative, He is aware of false positives and false negatives. He recalls calling the company in the past and being told about high rates of inaccuracy with the Cologuard so he has mixed feelings about the Cologuard being positive given he is not having any symptoms.   He is currently in remission from prostate cancer and is not receiving active treatment. He mentions a past procedure where two polyps were removed from his prostate, which was painful as it was done without anesthesia.  He has a history of high blood pressure but denies any recent  cardiac symptoms. He reports weight gain over the holidays and denies any significant weight loss.  His family history includes his father and mother, who lived to 69 and 93 years respectively, both passing away due to COVID-19 in 2020. His sister also had long-term COVID-19 complications leading to significant lung damage.      Wt Readings from Last 6 Encounters:  02/07/24 (!) 302 lb 6.4 oz (137.2 kg)  06/19/20 285 lb (129.3 kg)  03/20/20 289 lb (131.1 kg)  03/20/20 287 lb 8 oz (130.4 kg)  03/20/20 289 lb (131.1 kg)  02/21/20 284 lb 11.2 oz (129.1 kg)    Body mass index is 39.9 kg/m.  Current Outpatient Medications  Medication Sig Dispense Refill   amLODipine (NORVASC) 10 MG tablet Take 10 mg by mouth at bedtime.     aspirin 81 MG chewable tablet Chew 81 mg by mouth daily.     diphenhydrAMINE (BENADRYL) 50 MG tablet Take 50 mg by mouth every 6 (six) hours as needed for itching.     HYDROcodone -acetaminophen  (NORCO) 5-325 MG tablet Take 1 tablet by mouth every 6 (six) hours as needed for moderate pain. 8 tablet 0   losartan-hydrochlorothiazide (HYZAAR) 100-25 MG tablet Take 1 tablet by mouth daily.     naproxen sodium (ALEVE) 220 MG tablet Take 220 mg by mouth daily.     rosuvastatin (CRESTOR) 20 MG tablet Take 10 mg by mouth at bedtime.     Current Facility-Administered Medications  Medication Dose Route Frequency Provider Last Rate  Last Admin   degarelix  (FIRMAGON ) injection 240 mg  240 mg Subcutaneous Once Wrenn, John, MD        Past Medical History:  Diagnosis Date   Arthritis    hands   Hypertension    followed by pcp   Lyme disease 2010   per pt dx 2010 after tick bite, stated had mild symptoms that resolved other than since then gets hive when eats beef / pork   Nocturia    Prostate cancer [facility]) urologist--- dr wrenn/  oncologist--- dr patrcia   dx 11-09-2019,  Stage T2a, Gleason 4+3    Past Surgical History:  Procedure Laterality Date   CYSTOSCOPY  02/21/2020    Procedure: CYSTOSCOPY FLEXIBLE;  Surgeon: Watt Rush, MD;  Location: [facility];  Service: Urology;;  No seeds detected in bladder per Dr. Watt   NO PAST SURGERIES     PROSTATE BIOPSY     RADIOACTIVE SEED IMPLANT N/A 02/21/2020   Procedure: RADIOACTIVE SEED IMPLANT/BRACHYTHERAPY IMPLANT;  Surgeon: Watt Rush, MD;  Location: [facility];  Service: Urology;  Laterality: N/A;   SPACE OAR INSTILLATION N/A 02/21/2020   Procedure: SPACE OAR INSTILLATION;  Surgeon: Watt Rush, MD;  Location: [facility];  Service: Urology;  Laterality: N/A;    Family History  Problem Relation Age of Onset   Breast cancer Mother    Breast cancer Paternal Aunt    Prostate cancer Neg Hx    Pancreatic cancer Neg Hx    Colon cancer Neg Hx     Allergies as of 02/07/2024 - Review Complete 02/07/2024  Allergen Reaction Noted   Bovine (beef) protein-containing drug products Hives 02/14/2020   Porcine (pork) protein-containing drug products Hives 02/14/2020    Social History   Socioeconomic History   Marital status: Widowed    Spouse name: Not on file   Number of children: 1   Years of education: Not on file   Highest education level: Not on file  Occupational History   Occupation: retired  Tobacco Use   Smoking status: Every Day    Current packs/day: 0.25    Average packs/day: 0.3 packs/day for 30.0 years (7.5 ttl pk-yrs)    Types: Cigarettes   Smokeless tobacco: Never   Tobacco comments:    12-09-20212 per pt 5 cig per day  Vaping Use   Vaping status: Never Used  Substance and Sexual Activity   Alcohol  use: Not Currently   Drug use: Never   Sexual activity: Not Currently    Comment: widowed. no interested.   Other Topics Concern   Not on file  Social History Narrative   Not on file   Social Drivers of Health   Financial Resource Strain: Not on file  Food Insecurity: Not on file  Transportation Needs: Not on file  Physical Activity: Not on  file  Stress: Not on file  Social Connections: Not on file  Intimate Partner Violence: Not on file    Review of Systems   Gen: Denies any fever, chills, fatigue, weight loss, lack of appetite.  CV: Denies chest pain, heart palpitations, peripheral edema, syncope.  Resp: Denies shortness of breath at rest or with exertion. Denies wheezing or cough.  GI: see HPI GU : Denies urinary burning, urinary frequency, urinary hesitancy MS: Denies joint pain, muscle weakness, cramps, or limitation of movement.  Derm: Denies rash, itching, dry skin Psych: Denies depression, anxiety, memory loss, and confusion Heme: Denies bruising, bleeding, and enlarged lymph nodes.  Physical Exam   BP 136/80 (BP Location: Left Arm, Patient Position: Sitting, Cuff Size: Large)   Pulse 77   Temp 97.6 F (36.4 C) (Temporal)   Ht 6' 1 (1.854 m)   Wt (!) 302 lb 6.4 oz (137.2 kg)   BMI 39.90 kg/m   General:   Alert and oriented. Pleasant and cooperative. Well-nourished and well-developed.  Head:  Normocephalic and atraumatic. Eyes:  Without icterus, sclera clear and conjunctiva pink.  Ears:  Normal auditory acuity. Mouth:  No deformity or lesions, oral mucosa pink.  Lungs:  Clear to auscultation bilaterally. No wheezes, rales, or rhonchi. No distress.  Heart:  S1, S2 present without murmurs appreciated.  Abdomen:  +BS, soft, non-tender and non-distended, but rounded. No HSM noted. No guarding or rebound. No masses appreciated.  Rectal:  deferred  Msk:  Symmetrical without gross deformities. Normal posture. Extremities:  Without edema. Neurologic:  Alert and  oriented x4;  grossly normal neurologically. Skin:  Intact without significant lesions or rashes. Psych:  Alert and cooperative. Normal mood and affect.  Assessment & Plan   Andrew Gordon is a 73 y.o. male with a history of hypercholesteremia, hypertension, tobacco use, obesity, and prostate cancer presenting today to discuss Colonoscopy Given  Positive Cologuard.     Positive cologuard, Colorectal cancer screening Positive Cologuard test with no symptoms of colorectal cancer. No family history of colon cancer or polyps. Previous colonoscopy 28 years ago was normal. Discussed false positives and negatives of Cologuard. Given prostate cancer history, increased risk for colorectal cancer. Discussed colonoscopy as the gold standard for further evaluation. He expressed reluctance but agreed to proceed with colonoscopy, understanding potential findings and follow-up recommendations. Discussed insurance requirements for colonoscopy following a positive Cologuard test to ensure early detection and potential for less invasive treatment if necessary. He states he will not do another colonoscopy after this no matter the findings given his age.     Proceed with colonoscopy with propofol  by Dr. Shaaron in near future: the risks, benefits, and alternatives have been discussed with the patient in detail. The patient states understanding and desires to proceed. ASA 2   Follow up   Follow up as needed   Charmaine Melia, MSN, FNP-BC, AGACNP-BC [facility]

## 2024-02-07 NOTE — Progress Notes (Signed)
 GI Office Note    Referring Provider: Hairfield, Keavie C, NP Primary Care Physician:  Hairfield, Keavie C  Primary Gastroenterologist: Lamar HERO.Rourk, MD  Chief Complaint   Chief Complaint  Patient presents with   positive cologuard     Positive cologuard    History of Present Illness   Andrew Gordon is a 73 y.o. male presenting today at the request of Hairfield, Keavie C, NP for positive Cologuard.   Normal PSA and negative urinalysis in February 2025.  Positive Cologuard 12/13/2023.  No prior colonoscopy report within our system.  Per review of referral paperwork he had labs completed in September 2025 with hemoglobin 15.7, platelets 274, normal TSH, low HDL but with otherwise normal lipid panel, normal LFTs and normal PSA.  Today:  Discussed the use of AI scribe software for clinical note transcription with the patient, who gave verbal consent to proceed.  He had a colonoscopy 28 years ago in [place], which showed no polyps. He has no family history of colon cancer or colon polyps. He has regular bowel movements, typically once a day, without straining, blood, or melena. He attributes any dark stool to his diet, which is high in vegetables and low in meat. No abdominal pain or changes in appetite, although he notes a gradual decrease in appetite with age.  He has undergone multiple Cologuard tests in the past that have been negative, He is aware of false positives and false negatives. He recalls calling the company in the past and being told about high rates of inaccuracy with the Cologuard so he has mixed feelings about the Cologuard being positive given he is not having any symptoms.   He is currently in remission from prostate cancer and is not receiving active treatment. He mentions a past procedure where two polyps were removed from his prostate, which was painful as it was done without anesthesia.  He has a history of high blood pressure but denies any recent  cardiac symptoms. He reports weight gain over the holidays and denies any significant weight loss.  His family history includes his father and mother, who lived to 69 and 93 years respectively, both passing away due to COVID-19 in 2020. His sister also had long-term COVID-19 complications leading to significant lung damage.      Wt Readings from Last 6 Encounters:  02/07/24 (!) 302 lb 6.4 oz (137.2 kg)  06/19/20 285 lb (129.3 kg)  03/20/20 289 lb (131.1 kg)  03/20/20 287 lb 8 oz (130.4 kg)  03/20/20 289 lb (131.1 kg)  02/21/20 284 lb 11.2 oz (129.1 kg)    Body mass index is 39.9 kg/m.  Current Outpatient Medications  Medication Sig Dispense Refill   amLODipine (NORVASC) 10 MG tablet Take 10 mg by mouth at bedtime.     aspirin 81 MG chewable tablet Chew 81 mg by mouth daily.     diphenhydrAMINE (BENADRYL) 50 MG tablet Take 50 mg by mouth every 6 (six) hours as needed for itching.     HYDROcodone -acetaminophen  (NORCO) 5-325 MG tablet Take 1 tablet by mouth every 6 (six) hours as needed for moderate pain. 8 tablet 0   losartan-hydrochlorothiazide (HYZAAR) 100-25 MG tablet Take 1 tablet by mouth daily.     naproxen sodium (ALEVE) 220 MG tablet Take 220 mg by mouth daily.     rosuvastatin (CRESTOR) 20 MG tablet Take 10 mg by mouth at bedtime.     Current Facility-Administered Medications  Medication Dose Route Frequency Provider Last Rate  Last Admin   degarelix  (FIRMAGON ) injection 240 mg  240 mg Subcutaneous Once Wrenn, John, MD        Past Medical History:  Diagnosis Date   Arthritis    hands   Hypertension    followed by pcp   Lyme disease 2010   per pt dx 2010 after tick bite, stated had mild symptoms that resolved other than since then gets hive when eats beef / pork   Nocturia    Prostate cancer [facility]) urologist--- dr wrenn/  oncologist--- dr patrcia   dx 11-09-2019,  Stage T2a, Gleason 4+3    Past Surgical History:  Procedure Laterality Date   CYSTOSCOPY  02/21/2020    Procedure: CYSTOSCOPY FLEXIBLE;  Surgeon: Watt Rush, MD;  Location: [facility];  Service: Urology;;  No seeds detected in bladder per Dr. Watt   NO PAST SURGERIES     PROSTATE BIOPSY     RADIOACTIVE SEED IMPLANT N/A 02/21/2020   Procedure: RADIOACTIVE SEED IMPLANT/BRACHYTHERAPY IMPLANT;  Surgeon: Watt Rush, MD;  Location: [facility];  Service: Urology;  Laterality: N/A;   SPACE OAR INSTILLATION N/A 02/21/2020   Procedure: SPACE OAR INSTILLATION;  Surgeon: Watt Rush, MD;  Location: [facility];  Service: Urology;  Laterality: N/A;    Family History  Problem Relation Age of Onset   Breast cancer Mother    Breast cancer Paternal Aunt    Prostate cancer Neg Hx    Pancreatic cancer Neg Hx    Colon cancer Neg Hx     Allergies as of 02/07/2024 - Review Complete 02/07/2024  Allergen Reaction Noted   Bovine (beef) protein-containing drug products Hives 02/14/2020   Porcine (pork) protein-containing drug products Hives 02/14/2020    Social History   Socioeconomic History   Marital status: Widowed    Spouse name: Not on file   Number of children: 1   Years of education: Not on file   Highest education level: Not on file  Occupational History   Occupation: retired  Tobacco Use   Smoking status: Every Day    Current packs/day: 0.25    Average packs/day: 0.3 packs/day for 30.0 years (7.5 ttl pk-yrs)    Types: Cigarettes   Smokeless tobacco: Never   Tobacco comments:    12-09-20212 per pt 5 cig per day  Vaping Use   Vaping status: Never Used  Substance and Sexual Activity   Alcohol  use: Not Currently   Drug use: Never   Sexual activity: Not Currently    Comment: widowed. no interested.   Other Topics Concern   Not on file  Social History Narrative   Not on file   Social Drivers of Health   Financial Resource Strain: Not on file  Food Insecurity: Not on file  Transportation Needs: Not on file  Physical Activity: Not on  file  Stress: Not on file  Social Connections: Not on file  Intimate Partner Violence: Not on file    Review of Systems   Gen: Denies any fever, chills, fatigue, weight loss, lack of appetite.  CV: Denies chest pain, heart palpitations, peripheral edema, syncope.  Resp: Denies shortness of breath at rest or with exertion. Denies wheezing or cough.  GI: see HPI GU : Denies urinary burning, urinary frequency, urinary hesitancy MS: Denies joint pain, muscle weakness, cramps, or limitation of movement.  Derm: Denies rash, itching, dry skin Psych: Denies depression, anxiety, memory loss, and confusion Heme: Denies bruising, bleeding, and enlarged lymph nodes.  Physical Exam   BP 136/80 (BP Location: Left Arm, Patient Position: Sitting, Cuff Size: Large)   Pulse 77   Temp 97.6 F (36.4 C) (Temporal)   Ht 6' 1 (1.854 m)   Wt (!) 302 lb 6.4 oz (137.2 kg)   BMI 39.90 kg/m   General:   Alert and oriented. Pleasant and cooperative. Well-nourished and well-developed.  Head:  Normocephalic and atraumatic. Eyes:  Without icterus, sclera clear and conjunctiva pink.  Ears:  Normal auditory acuity. Mouth:  No deformity or lesions, oral mucosa pink.  Lungs:  Clear to auscultation bilaterally. No wheezes, rales, or rhonchi. No distress.  Heart:  S1, S2 present without murmurs appreciated.  Abdomen:  +BS, soft, non-tender and non-distended, but rounded. No HSM noted. No guarding or rebound. No masses appreciated.  Rectal:  deferred  Msk:  Symmetrical without gross deformities. Normal posture. Extremities:  Without edema. Neurologic:  Alert and  oriented x4;  grossly normal neurologically. Skin:  Intact without significant lesions or rashes. Psych:  Alert and cooperative. Normal mood and affect.  Assessment & Plan   Andrew Gordon is a 73 y.o. male with a history of hypercholesteremia, hypertension, tobacco use, obesity, and prostate cancer presenting today to discuss Colonoscopy Given  Positive Cologuard.     Positive cologuard, Colorectal cancer screening Positive Cologuard test with no symptoms of colorectal cancer. No family history of colon cancer or polyps. Previous colonoscopy 28 years ago was normal. Discussed false positives and negatives of Cologuard. Given prostate cancer history, increased risk for colorectal cancer. Discussed colonoscopy as the gold standard for further evaluation. He expressed reluctance but agreed to proceed with colonoscopy, understanding potential findings and follow-up recommendations. Discussed insurance requirements for colonoscopy following a positive Cologuard test to ensure early detection and potential for less invasive treatment if necessary. He states he will not do another colonoscopy after this no matter the findings given his age.     Proceed with colonoscopy with propofol  by Dr. Shaaron in near future: the risks, benefits, and alternatives have been discussed with the patient in detail. The patient states understanding and desires to proceed. ASA 2   Follow up   Follow up as needed   Charmaine Melia, MSN, FNP-BC, AGACNP-BC [facility]

## 2024-02-07 NOTE — Patient Instructions (Signed)
 We will get you scheduled for colonoscopy in the near future with Dr. Shaaron to evaluate you for the positive Cologuard.   I hope you have a Gertha Christmas and a wonderful new year!  It was a pleasure to see you today. I want to create trusting relationships with patients. If you receive a survey regarding your visit,  I greatly appreciate you taking time to fill this out on paper or through your MyChart. I value your feedback.  Charmaine Melia, MSN, FNP-BC, AGACNP-BC [facility]

## 2024-03-05 ENCOUNTER — Ambulatory Visit (HOSPITAL_COMMUNITY)
Admission: RE | Admit: 2024-03-05 | Discharge: 2024-03-05 | Disposition: A | Discharge status: Home / Self Care | Attending: Internal Medicine | Admitting: Internal Medicine

## 2024-03-05 ENCOUNTER — Encounter (HOSPITAL_COMMUNITY): Payer: Self-pay | Admitting: Internal Medicine

## 2024-03-05 ENCOUNTER — Ambulatory Visit (HOSPITAL_COMMUNITY)

## 2024-03-05 ENCOUNTER — Encounter (HOSPITAL_COMMUNITY)
Admission: RE | Disposition: A | Discharge status: Home / Self Care | Payer: Self-pay | Source: Home / Self Care | Attending: Internal Medicine

## 2024-03-05 ENCOUNTER — Other Ambulatory Visit: Payer: Self-pay

## 2024-03-05 DIAGNOSIS — Z1211 Encounter for screening for malignant neoplasm of colon: Secondary | ICD-10-CM | POA: Diagnosis present

## 2024-03-05 DIAGNOSIS — Z79899 Other long term (current) drug therapy: Secondary | ICD-10-CM | POA: Diagnosis not present

## 2024-03-05 DIAGNOSIS — R195 Other fecal abnormalities: Secondary | ICD-10-CM | POA: Insufficient documentation

## 2024-03-05 DIAGNOSIS — D120 Benign neoplasm of cecum: Secondary | ICD-10-CM | POA: Insufficient documentation

## 2024-03-05 DIAGNOSIS — Z7982 Long term (current) use of aspirin: Secondary | ICD-10-CM | POA: Diagnosis not present

## 2024-03-05 DIAGNOSIS — M1990 Unspecified osteoarthritis, unspecified site: Secondary | ICD-10-CM | POA: Insufficient documentation

## 2024-03-05 DIAGNOSIS — C61 Malignant neoplasm of prostate: Secondary | ICD-10-CM | POA: Diagnosis not present

## 2024-03-05 DIAGNOSIS — Z6839 Body mass index (BMI) 39.0-39.9, adult: Secondary | ICD-10-CM | POA: Diagnosis not present

## 2024-03-05 DIAGNOSIS — I10 Essential (primary) hypertension: Secondary | ICD-10-CM | POA: Insufficient documentation

## 2024-03-05 DIAGNOSIS — F17210 Nicotine dependence, cigarettes, uncomplicated: Secondary | ICD-10-CM | POA: Diagnosis not present

## 2024-03-05 DIAGNOSIS — K5730 Diverticulosis of large intestine without perforation or abscess without bleeding: Secondary | ICD-10-CM

## 2024-03-05 DIAGNOSIS — E66813 Obesity, class 3: Secondary | ICD-10-CM | POA: Insufficient documentation

## 2024-03-05 HISTORY — PX: COLONOSCOPY: SHX5424

## 2024-03-05 HISTORY — PX: POLYPECTOMY: SHX149

## 2024-03-05 SURGERY — COLONOSCOPY
Anesthesia: General

## 2024-03-05 MED ORDER — PHENYLEPHRINE 80 MCG/ML (10ML) SYRINGE FOR IV PUSH (FOR BLOOD PRESSURE SUPPORT)
PREFILLED_SYRINGE | INTRAVENOUS | Status: DC | PRN
  Administered 2024-03-05: 80 ug via INTRAVENOUS

## 2024-03-05 MED ORDER — LACTATED RINGERS IV SOLN: INTRAVENOUS | Status: DC

## 2024-03-05 MED ORDER — GLUCAGON HCL RDNA (DIAGNOSTIC) 1 MG IJ SOLR
INTRAMUSCULAR | Status: DC | PRN
  Administered 2024-03-05: .5 mg via INTRAVENOUS

## 2024-03-05 MED ORDER — PROPOFOL 500 MG/50ML IV EMUL
INTRAVENOUS | Status: DC | PRN
  Administered 2024-03-05: 150 ug/kg/min via INTRAVENOUS
  Administered 2024-03-05: 100 mg via INTRAVENOUS

## 2024-03-05 NOTE — Interval H&P Note (Signed)
 History and Physical Interval Note:  03/05/2024 10:15 AM  Andrew Gordon  has presented today for surgery, with the diagnosis of POSITIVE COLOGUARD.  The various methods of treatment have been discussed with the patient and family. After consideration of risks, benefits and other options for treatment, the patient has consented to  Procedures with comments: COLONOSCOPY (N/A) - 10:00AM, ASA 2 as a surgical intervention.  The patient's history has been reviewed, patient examined, no change in status, stable for surgery.  I have reviewed the patient's chart and labs.  Questions were answered to the patient's satisfaction.     Andrew Gordon    No change.  Diagnostic colonoscopy per plan.  The risks, benefits, limitations, alternatives and imponderables have been reviewed with the patient. Questions have been answered. All parties are agreeable.

## 2024-03-05 NOTE — Transfer of Care (Signed)
 Immediate Anesthesia Transfer of Care Note  Patient: Andrew Gordon  Procedure(s) Performed: COLONOSCOPY POLYPECTOMY, INTESTINE  Patient Location: PACU and Endoscopy Unit  Anesthesia Type:General  Level of Consciousness: awake  Airway & Oxygen Therapy: Patient Spontanous Breathing  Post-op Assessment: Report given to RN  Post vital signs: Reviewed and stable  Last Vitals:  Vitals Value Taken Time  BP    Temp    Pulse    Resp    SpO2      Last Pain:  Vitals:   03/05/24 0849  TempSrc: Oral  PainSc: 0-No pain      Patients Stated Pain Goal: 6 (03/05/24 0849)  Complications: No notable events documented.

## 2024-03-05 NOTE — Anesthesia Postprocedure Evaluation (Signed)
"   Anesthesia Post Note  Patient: Andrew Gordon  Procedure(s) Performed: COLONOSCOPY POLYPECTOMY, INTESTINE  Patient location during evaluation: PACU Anesthesia Type: General Level of consciousness: awake and alert Pain management: pain level controlled Vital Signs Assessment: post-procedure vital signs reviewed and stable Respiratory status: spontaneous breathing, nonlabored ventilation, respiratory function stable and patient connected to nasal cannula oxygen Cardiovascular status: blood pressure returned to baseline and stable Postop Assessment: no apparent nausea or vomiting Anesthetic complications: no   No notable events documented.   Last Vitals:  Vitals:   03/05/24 1047 03/05/24 1052  BP: (!) 107/47 (!) 119/58  Pulse: 91 90  Resp: 17 17  Temp: 36.6 C   SpO2: 93% 93%    Last Pain:  Vitals:   03/05/24 1047  TempSrc: Axillary  PainSc: 0-No pain                 Andrea Limes      "

## 2024-03-05 NOTE — Op Note (Addendum)
 [facility] Patient Name: Andrew Gordon Procedure Date: 03/05/2024 9:58 AM MRN: [id number] Date of Birth: 09-27-1950 Attending MD: Lamar Ozell Hollingshead , MD, [id number] CSN: [id number] Age: 73 Admit Type: Outpatient Procedure:                Colonoscopy Indications:              Positive Cologuard test Providers:                Lamar Ozell Hollingshead, MD, Olam Ada, RN, Bascom Blush Referring MD:              Medicines:                Propofol  per Anesthesia Complications:            No immediate complications. Estimated Blood Loss:     Estimated blood loss was minimal. Procedure:                Pre-Anesthesia Assessment:                           - Prior to the procedure, a History and Physical                            was performed, and patient medications and                            allergies were reviewed. The patient's tolerance of                            previous anesthesia was also reviewed. The risks                            and benefits of the procedure and the sedation                            options and risks were discussed with the patient.                            All questions were answered, and informed consent                            was obtained. Prior Anticoagulants: The patient has                            taken no anticoagulant or antiplatelet agents. ASA                            Grade Assessment: III - A patient with severe                            systemic disease. After reviewing the risks and  benefits, the patient was deemed in satisfactory                            condition to undergo the procedure.                           After obtaining informed consent, the colonoscope                            was passed under direct vision. Throughout the                            procedure, the patient's blood pressure, pulse, and                            oxygen saturations were  monitored continuously. The                            [id number]-[id number] ([id number]) Colon was introduced through                            the anus and advanced to the the cecum, identified                            by appendiceal orifice and ileocecal valve. The                            colonoscopy was performed without difficulty. The                            patient tolerated the procedure well. The quality                            of the bowel preparation was adequate. The                            ileocecal valve, appendiceal orifice, and rectum                            were photographed. Scope In: 10:21:52 AM Scope Out: 10:38:54 AM Total Procedure Duration: 0 hours 17 minutes 2 seconds  Findings:      The perianal and digital rectal examinations were normal.      Scattered medium-mouthed diverticula were found in the sigmoid colon and       descending colon.      A 3 mm polyp was found in the cecum. The polyp was sessile. The polyp       was removed with a cold snare. Resection and retrieval were complete.      The exam was otherwise without abnormality on direct and retroflexion       views. Impression:               - Diverticulosis in the sigmoid colon and in the  descending colon.                           - One 3 mm polyp in the cecum, removed with a cold                            snare. Resected and retrieved.                           - The examination was otherwise normal on direct                            and retroflexion views. Moderate Sedation:      Moderate (conscious) sedation was administered by the nurse and       supervised by the endoscopist. The following parameters were monitored:       oxygen saturation, heart rate, blood pressure, respiratory rate, EKG,       adequacy of pulmonary ventilation, and response to care. Recommendation:           - Patient has a contact number available for                            emergencies.  The signs and symptoms of potential                            delayed complications were discussed with the                            patient. Return to normal activities tomorrow.                            Written discharge instructions were provided to the                            patient.                           - Advance diet as tolerated.                           - Patient medication history reviewed. Patient is                            appropriately not taking any medications.                           - Repeat colonoscopy date to be determined after                            pending pathology results are reviewed for                            surveillance.                           - Return to GI office (date  not yet determined). Procedure Code(s):        --- Professional ---                           [phone], Colonoscopy, flexible; with removal of                            tumor(s), polyp(s), or other lesion(s) by snare                            technique Diagnosis Code(s):        --- Professional ---                           D12.0, Benign neoplasm of cecum                           R19.5, Other fecal abnormalities                           K57.30, Diverticulosis of large intestine without                            perforation or abscess without bleeding CPT copyright 2022 [employer]. All rights reserved. The codes documented in this report are preliminary and upon coder review may  be revised to meet current compliance requirements. Lamar HERO. Yanna Leaks, MD Lamar Ozell Hollingshead, MD 03/05/2024 10:45:32 AM This report has been signed electronically. Number of Addenda: 0

## 2024-03-05 NOTE — Anesthesia Preprocedure Evaluation (Addendum)
"                                    Anesthesia Evaluation  Patient identified by MRN, date of birth, ID band Patient awake    Reviewed: Allergy & Precautions, H&P , NPO status , Patient's Chart, lab work & pertinent test results  Airway Mallampati: II  TM Distance: >3 FB Neck ROM: Full    Dental no notable dental hx.    Pulmonary Current Smoker and Patient abstained from smoking.   Pulmonary exam normal breath sounds clear to auscultation       Cardiovascular hypertension, Normal cardiovascular exam Rhythm:Regular Rate:Normal  Denies CP/SOB   Neuro/Psych negative neurological ROS  negative psych ROS   GI/Hepatic negative GI ROS, Neg liver ROS,,,  Endo/Other    Class 3 obesity  Renal/GU negative Renal ROS  negative genitourinary   Musculoskeletal  (+) Arthritis ,    Abdominal  (+) + obese  Peds negative pediatric ROS (+)  Hematology negative hematology ROS (+)   Anesthesia Other Findings Hx  prostate ca  Reproductive/Obstetrics negative OB ROS                              Anesthesia Physical Anesthesia Plan  ASA: 2  Anesthesia Plan: General   Post-op Pain Management:    Induction: Intravenous  PONV Risk Score and Plan:   Airway Management Planned: Nasal Cannula  Additional Equipment:   Intra-op Plan:   Post-operative Plan:   Informed Consent: I have reviewed the patients History and Physical, chart, labs and discussed the procedure including the risks, benefits and alternatives for the proposed anesthesia with the patient or authorized representative who has indicated his/her understanding and acceptance.     Dental advisory given  Plan Discussed with: CRNA  Anesthesia Plan Comments:          Anesthesia Quick Evaluation  "

## 2024-03-05 NOTE — Interval H&P Note (Signed)
 History and Physical Interval Note:  03/05/2024 10:08 AM  Andrew Gordon  has presented today for surgery, with the diagnosis of POSITIVE COLOGUARD.  The various methods of treatment have been discussed with the patient and family. After consideration of risks, benefits and other options for treatment, the patient has consented to  Procedures with comments: COLONOSCOPY (N/A) - 10:00AM, ASA 2 as a surgical intervention.  The patient's history has been reviewed, patient examined, no change in status, stable for surgery.  I have reviewed the patient's chart and labs.  Questions were answered to the patient's satisfaction.     Andrew Gordon   no change.  Diagnostic colonoscopy per plan.  The risks, benefits, limitations, alternatives and imponderables have been reviewed with the patient. Questions have been answered. All parties are agreeable.

## 2024-03-05 NOTE — Discharge Instructions (Signed)
" °  Colonoscopy Discharge Instructions  Read the instructions outlined below and refer to this sheet in the next few weeks. These discharge instructions provide you with general information on caring for yourself after you leave the hospital. Your doctor may also give you specific instructions. While your treatment has been planned according to the most current medical practices available, unavoidable complications occasionally occur. If you have any problems or questions after discharge, call Dr. Shaaron at [phone]. ACTIVITY You may resume your regular activity, but move at a slower pace for the next 24 hours.  Take frequent rest periods for the next 24 hours.  Walking will help get rid of the air and reduce the bloated feeling in your belly (abdomen).  No driving for 24 hours (because of the medicine (anesthesia) used during the test).   Do not sign any important legal documents or operate any machinery for 24 hours (because of the anesthesia used during the test).  NUTRITION Drink plenty of fluids.  You may resume your normal diet as instructed by your doctor.  Begin with a light meal and progress to your normal diet. Heavy or fried foods are harder to digest and may make you feel sick to your stomach (nauseated).  Avoid alcoholic beverages for 24 hours or as instructed.  MEDICATIONS You may resume your normal medications unless your doctor tells you otherwise.  WHAT YOU CAN EXPECT TODAY Some feelings of bloating in the abdomen.  Passage of more gas than usual.  Spotting of blood in your stool or on the toilet paper.  IF YOU HAD POLYPS REMOVED DURING THE COLONOSCOPY: No aspirin products for 7 days or as instructed.  No alcohol  for 7 days or as instructed.  Eat a soft diet for the next 24 hours.  FINDING OUT THE RESULTS OF YOUR TEST Not all test results are available during your visit. If your test results are not back during the visit, make an appointment with your caregiver to find out the  results. Do not assume everything is normal if you have not heard from your caregiver or the medical facility. It is important for you to follow up on all of your test results.  SEEK IMMEDIATE MEDICAL ATTENTION IF: You have more than a spotting of blood in your stool.  Your belly is swollen (abdominal distention).  You are nauseated or vomiting.  You have a temperature over 101.  You have abdominal pain or discomfort that is severe or gets worse throughout the day.     1 polyp found and removed  Diverticulosis present.  Information on diverticulosis provided   further recommendations to follow pending review of pathology report "

## 2024-03-06 ENCOUNTER — Encounter (HOSPITAL_COMMUNITY): Payer: Self-pay | Admitting: Internal Medicine

## 2024-03-06 LAB — SURGICAL PATHOLOGY

## 2024-03-07 ENCOUNTER — Ambulatory Visit: Payer: Self-pay | Admitting: Internal Medicine
